# Patient Record
Sex: Male | Born: 1935 | Race: White | Hispanic: No | Marital: Single | Smoking: Never smoker
Health system: Southern US, Community
[De-identification: ages and names within clinical notes are randomized; demographics above are authoritative.]

## PROBLEM LIST (undated history)

## (undated) DIAGNOSIS — D126 Benign neoplasm of colon, unspecified: Secondary | ICD-10-CM

## (undated) DIAGNOSIS — K222 Esophageal obstruction: Secondary | ICD-10-CM

## (undated) DIAGNOSIS — M1990 Unspecified osteoarthritis, unspecified site: Secondary | ICD-10-CM

## (undated) DIAGNOSIS — I10 Essential (primary) hypertension: Secondary | ICD-10-CM

## (undated) DIAGNOSIS — H9190 Unspecified hearing loss, unspecified ear: Secondary | ICD-10-CM

## (undated) DIAGNOSIS — IMO0001 Reserved for inherently not codable concepts without codable children: Secondary | ICD-10-CM

## (undated) DIAGNOSIS — K224 Dyskinesia of esophagus: Secondary | ICD-10-CM

## (undated) DIAGNOSIS — K219 Gastro-esophageal reflux disease without esophagitis: Secondary | ICD-10-CM

## (undated) DIAGNOSIS — K449 Diaphragmatic hernia without obstruction or gangrene: Secondary | ICD-10-CM

## (undated) DIAGNOSIS — H547 Unspecified visual loss: Secondary | ICD-10-CM

## (undated) DIAGNOSIS — K2270 Barrett's esophagus without dysplasia: Secondary | ICD-10-CM

## (undated) HISTORY — PX: COLONOSCOPY: SHX174

## (undated) HISTORY — DX: Unspecified osteoarthritis, unspecified site: M19.90

## (undated) HISTORY — PX: TOTAL KNEE ARTHROPLASTY: SHX125

## (undated) HISTORY — DX: Diaphragmatic hernia without obstruction or gangrene: K44.9

## (undated) HISTORY — PX: TONSILLECTOMY AND ADENOIDECTOMY: SUR1326

## (undated) HISTORY — DX: Unspecified visual loss: H54.7

## (undated) HISTORY — DX: Benign neoplasm of colon, unspecified: D12.6

## (undated) HISTORY — DX: Essential (primary) hypertension: I10

## (undated) HISTORY — DX: Reserved for inherently not codable concepts without codable children: IMO0001

## (undated) HISTORY — PX: INNER EAR SURGERY: SHX679

## (undated) HISTORY — DX: Dyskinesia of esophagus: K22.4

## (undated) HISTORY — PX: SCROTAL SURGERY: SHX2387

## (undated) HISTORY — DX: Gastro-esophageal reflux disease without esophagitis: K21.9

## (undated) HISTORY — DX: Barrett's esophagus without dysplasia: K22.70

## (undated) HISTORY — DX: Esophageal obstruction: K22.2

## (undated) HISTORY — PX: UPPER GASTROINTESTINAL ENDOSCOPY: SHX188

## (undated) HISTORY — DX: Unspecified hearing loss, unspecified ear: H91.90

---

## 2004-06-15 DIAGNOSIS — K2270 Barrett's esophagus without dysplasia: Secondary | ICD-10-CM

## 2004-06-15 HISTORY — DX: Barrett's esophagus without dysplasia: K22.70

## 2005-03-04 ENCOUNTER — Ambulatory Visit: Payer: Self-pay | Admitting: Gastroenterology

## 2005-03-09 ENCOUNTER — Encounter (INDEPENDENT_AMBULATORY_CARE_PROVIDER_SITE_OTHER): Payer: Self-pay | Admitting: *Deleted

## 2005-03-09 ENCOUNTER — Ambulatory Visit (HOSPITAL_COMMUNITY): Admission: RE | Admit: 2005-03-09 | Discharge: 2005-03-09 | Payer: Self-pay | Admitting: Gastroenterology

## 2005-05-11 ENCOUNTER — Ambulatory Visit: Payer: Self-pay | Admitting: Gastroenterology

## 2005-05-19 ENCOUNTER — Ambulatory Visit: Payer: Self-pay | Admitting: Gastroenterology

## 2005-05-19 ENCOUNTER — Encounter (INDEPENDENT_AMBULATORY_CARE_PROVIDER_SITE_OTHER): Payer: Self-pay | Admitting: *Deleted

## 2005-05-19 ENCOUNTER — Encounter (INDEPENDENT_AMBULATORY_CARE_PROVIDER_SITE_OTHER): Payer: Self-pay | Admitting: Specialist

## 2006-03-21 ENCOUNTER — Emergency Department (HOSPITAL_COMMUNITY): Admission: EM | Admit: 2006-03-21 | Discharge: 2006-03-21 | Payer: Self-pay | Admitting: Emergency Medicine

## 2006-08-18 ENCOUNTER — Ambulatory Visit: Payer: Self-pay | Admitting: Gastroenterology

## 2008-06-22 ENCOUNTER — Inpatient Hospital Stay (HOSPITAL_COMMUNITY): Admission: RE | Admit: 2008-06-22 | Discharge: 2008-06-25 | Payer: Self-pay | Admitting: Orthopedic Surgery

## 2009-04-11 ENCOUNTER — Telehealth (INDEPENDENT_AMBULATORY_CARE_PROVIDER_SITE_OTHER): Payer: Self-pay

## 2009-04-15 ENCOUNTER — Encounter (INDEPENDENT_AMBULATORY_CARE_PROVIDER_SITE_OTHER): Payer: Self-pay

## 2009-05-20 ENCOUNTER — Ambulatory Visit: Payer: Self-pay | Admitting: Internal Medicine

## 2009-11-28 ENCOUNTER — Telehealth: Payer: Self-pay | Admitting: Internal Medicine

## 2010-07-15 NOTE — Progress Notes (Signed)
Summary: Schedule Office Visit.   Phone Note Outgoing Call Call back at [facility] Phone [phone]   Call placed by: Harlow Mares CMA Duncan Dull),  November 28, 2009 5:03 PM Call placed to: Patient Summary of Call: Left message on patients machine to call back. patient needs to schedule an office visit.  Initial call taken by: Harlow Mares CMA Duncan Dull),  November 28, 2009 5:04 PM  Follow-up for Phone Call        Left a message on the patient machine to call back and schedule a previsit and procedure with our office. A letter will be mailed to the patient.   Follow-up by: Harlow Mares CMA Duncan Dull),  December 19, 2009 2:49 PM

## 2010-09-29 LAB — CBC
HCT: 45.6 % (ref 39.0–52.0)
Hemoglobin: 15.5 g/dL (ref 13.0–17.0)
MCHC: 34.1 g/dL (ref 30.0–36.0)
MCV: 88.9 fL (ref 78.0–100.0)
Platelets: 217 K/uL (ref 150–400)
RBC: 5.12 MIL/uL (ref 4.22–5.81)
RDW: 13.2 % (ref 11.5–15.5)
WBC: 6.5 K/uL (ref 4.0–10.5)

## 2010-09-29 LAB — PROTIME-INR
INR: 1 (ref 0.00–1.49)
INR: 1.2 (ref 0.00–1.49)
INR: 2.3 — ABNORMAL HIGH (ref 0.00–1.49)
Prothrombin Time: 12.8 s (ref 11.6–15.2)
Prothrombin Time: 15.4 seconds — ABNORMAL HIGH (ref 11.6–15.2)
Prothrombin Time: 27.2 seconds — ABNORMAL HIGH (ref 11.6–15.2)

## 2010-09-29 LAB — COMPREHENSIVE METABOLIC PANEL WITH GFR
ALT: 23 U/L (ref 0–53)
AST: 28 U/L (ref 0–37)
Albumin: 4.1 g/dL (ref 3.5–5.2)
Alkaline Phosphatase: 34 U/L — ABNORMAL LOW (ref 39–117)
BUN: 23 mg/dL (ref 6–23)
CO2: 21 meq/L (ref 19–32)
Calcium: 9.1 mg/dL (ref 8.4–10.5)
Chloride: 101 meq/L (ref 96–112)
Creatinine, Ser: 1.01 mg/dL (ref 0.4–1.5)
GFR calc non Af Amer: >60 mL/min
Glucose, Bld: 90 mg/dL (ref 70–99)
Potassium: 3.5 meq/L (ref 3.5–5.1)
Sodium: 132 meq/L — ABNORMAL LOW (ref 135–145)
Total Bilirubin: 0.8 mg/dL (ref 0.3–1.2)
Total Protein: 6.9 g/dL (ref 6.0–8.3)

## 2010-09-29 LAB — DIFFERENTIAL
Basophils Absolute: 0 K/uL (ref 0.0–0.1)
Basophils Relative: 0 % (ref 0–1)
Eosinophils Absolute: 0.4 K/uL (ref 0.0–0.7)
Eosinophils Relative: 6 % — ABNORMAL HIGH (ref 0–5)
Lymphocytes Relative: 21 % (ref 12–46)
Lymphs Abs: 1.4 K/uL (ref 0.7–4.0)
Monocytes Absolute: 0.6 K/uL (ref 0.1–1.0)
Monocytes Relative: 9 % (ref 3–12)
Neutro Abs: 4.1 K/uL (ref 1.7–7.7)
Neutrophils Relative %: 63 % (ref 43–77)

## 2010-09-29 LAB — URINALYSIS, ROUTINE W REFLEX MICROSCOPIC
Bilirubin Urine: NEGATIVE
Nitrite: NEGATIVE
Specific Gravity, Urine: 1.018 (ref 1.005–1.030)
pH: 5.5 (ref 5.0–8.0)

## 2010-09-29 LAB — TYPE AND SCREEN: Antibody Screen: NEGATIVE

## 2010-09-29 LAB — HEMOGLOBIN AND HEMATOCRIT, BLOOD
HCT: 35.4 % — ABNORMAL LOW (ref 39.0–52.0)
HCT: 37.8 % — ABNORMAL LOW (ref 39.0–52.0)
Hemoglobin: 12 g/dL — ABNORMAL LOW (ref 13.0–17.0)
Hemoglobin: 12.8 g/dL — ABNORMAL LOW (ref 13.0–17.0)

## 2010-09-29 LAB — APTT: aPTT: 30 s (ref 24–37)

## 2010-10-28 NOTE — H&P (Signed)
NAMEKILLIAN, SCHWER              ACCOUNT NO.:  [id number]   MEDICAL RECORD NO.:  [id number]          PATIENT TYPE:  INP   LOCATION:  NA                           FACILITY:  [facility]   PHYSICIAN:  Georges Lynch. Gioffre, M.D.DATE OF BIRTH:  08-Sep-1934   DATE OF ADMISSION:  DATE OF DISCHARGE:                              HISTORY & PHYSICAL   CHIEF COMPLAINT:  Painful bilateral knees, right greater than left.   HISTORY OF PRESENT ILLNESS:  Mr. Angel Hatfield is a 76 year old gentleman with  painful range of motion in bilateral knees, been progressing over the  course of time.  Patient's evaluation shows that he has bone on bone  lateral compartment of his right knee, he has a significant valgus  deformity.  He has failed conservative treatment.  Patient has elected  to proceed with a total knee arthroplasty.   ALLERGIES:  PENICILLIN CAUSING SWELLING OF HIS TONGUE, TYLENOL CAUSED  DIFFICULTY URINATING.   CURRENT MEDICAL HISTORY:  1. Lisinopril/hydrochlorothiazide 20/12.5 mg.  2. Fish oil.  3. Vitamin E.  4. Vitamin C.   PRIMARY CARE PHYSICIAN:  Dr. Benedetto Goad.   CARDIOLOGIST:  Dr. Roger Shelter.   PAST MEDICAL HISTORY:  1. Hypertension.  2. Hearing impaired without hearing aids.  3. Vision impaired.  4. History of shingles.  5. History of hiatal hernia.  6. History of reflux.  7. History of esophageal strictures.   REVIEW OF SYSTEMS:  NEUROLOGICALLY:  He is grossly intact, he just  complains of some vision issues and hearing issues without any aids.  He  has had shingles in the past in his lower back.  PULMONARY:  He does  have asthma, as a child was hospitalized; nothing as an adult.  CARDIOVASCULAR:  No chest pains, last stress test probably 10 years  previous.  No recent chest pains, he is very active on the farm.  GI:  He has had some esophageal strictures stretched, he has reflux disease  that is well controlled with over the counter medicines.  GU:  He only  has problems when  he takes Tylenol with difficulty urinating.  ENDOCRINE:  Unremarkable.   PAST SURGICAL HISTORY:  1. Ear drum surgery.  2. Scrotal surgery.  3. Recent colonoscopy without any complications.   FAMILY MEDICAL HISTORY:  Father is deceased from emphysema at the age of  28, also congestive heart failure.  Mother is deceased at the age of 79  from a stroke.  One brother deceased at 28 from an accident from a  tractor.   SOCIAL HISTORY:  Patient is married, he works as a Visual merchandiser.  Never  smoked, has a couple beers a week.  Lives with his family.   PHYSICAL EXAMINATION:  VITALS:  Height is 5 foot 10, weight is 185,  blood pressure is 126/78, pulse is 70 and regular, respirations 12,  patient is afebrile.  GENERAL:  This is a healthy-appearing, well-developed gentleman,  conscious, alert and appropriate.  Appears to be a good historian,  appears to be in no extreme distress.  When he stands he does have a  slight valgus deformity  of his right lower extremity.  HEENT:  Head was normocephalic.  Pupils equal, round and reactive.  He  can hear fairly easily with a normal voice.  NECK:  Supple, no palpable lymphadenopathy, good range of motion.  CHEST:  Lung sounds were clear and equal bilaterally.  No wheezes, rales  or rhonchi.  HEART:  Regular rate and rhythm.  ABDOMEN:  Soft, nontender.  EXTREMITIES:  Upper extremities have good range of motion in shoulder,  elbows and wrists; motor strength is 5/5.  He does have a slight  deformity of the biceps on his left from a tear.  Lower extremities got  good range of motion of both hips without any discomfort, right knee is  able to fully extend and he can flex it back to 120 degrees.  He does  have an obvious valgus deformity when left knee is straight, he has good  full range of motion, calves are soft.  Good range of motion both  ankles.  PERIPHERAL VASCULAR:  Carotid pulses were 2+, no bruits.  Radial pulses  2+, dorsalis pedis pulses 1+.  No  edema or venous stasis changes in the  lower extremity.  NEURO:  Patient was conscious, alert and appropriate.  Appeared to be a  good historian.  Breast, rectal and GU exams were deferred at this time.   IMPRESSION:  1. 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  2. History of asthma as a child.  3. History of vision and hearing impaired.  4. History of esophageal strictures.  5. History of hiatal hernia and reflux disease.   PLAN:  Patient will undergo all labs and tests prior to having a right  total knee arthroplasty by Dr. Darrelyn Hillock on June 22, 2008.      Jamelle Rushing, P.A.    ______________________________  Georges Lynch Darrelyn Hillock, M.D.    RWK/MEDQ  D:  06/14/2008  T:  06/14/2008  Job:  [id number]

## 2010-10-28 NOTE — Op Note (Signed)
NAMEANTARIO, Angel Hatfield              ACCOUNT NO.:  [id number]   MEDICAL RECORD NO.:  [id number]          PATIENT TYPE:  INP   LOCATION:  1604                         FACILITY:  [facility]   PHYSICIAN:  Georges Lynch. Gioffre, M.D.DATE OF BIRTH:  Nov 01, 1934   DATE OF PROCEDURE:  06/22/2008  DATE OF DISCHARGE:                               OPERATIVE REPORT   SURGEON:  Georges Lynch. Darrelyn Hillock, M.D.   ASSISTANT:  Marlowe Kays, M.D.   PREOPERATIVE DIAGNOSIS:  Severe degenerative arthritis of the left knee  with a valgus deformity.   POSTOPERATIVE DIAGNOSIS:  Severe degenerative arthritis of the left knee  with a valgus deformity.   OPERATION:  Left total knee arthroplasty utilizing the DePuy system.  All three components were cemented.  I used vancomycin in the cement.  The patella was a size 41 mm with 3 pegs.  The femur was a size 4, right  posterior stabilized type, the tibial tray was a size 4, the insert was  a size 4, 12.5 mm thickness.   PROCEDURE:  Under general anesthesia, routine orthopedic prep and  draping of the left lower extremity was carried out.  He had 1 gram of  IV vancomycin preop.  At this time, the leg was exsanguinated with an  Esmarch and tourniquet was elevated at 350 mmHg.  Following that, the  knee was flexed and an anterior incision was made in the usual fashion.  Two flaps were created.  I then extended the knee and did a median  parapatellar incision.  I reflected the patella laterally, flexed the  knee and did medial and lateral meniscectomies.  I then excised the  anterior posterior cruciate ligaments.  Following that, an initial drill  hole was made in the intercondylar notch of the femur, a #1 jig was  inserted.  I removed 11 mm of thickness off the distal femur.  At this  time, a #2 jig was utilized for measurement purposes.  We measured the  femur to be a size 4 right.  At this time, the next jig was inserted and  I did my anterior-posterior chamfering cuts in  the usual fashion.  Following that, the tibia was exposed and I removed 4 mm thickness off  of the medial aspect of the tibial plateau, using the medial aspects of  the tibial plateaus as my baseline.  Following that, we had  to go back  and remove another 2 mm.  After this was done, I then cut my keel cut  out of the proximal tibial metaphysis, but prior to that I did use my  spacer blocks to measure my tension in the collateral ligaments.  I then  cut my notch cut out of the distal femur.  After that, we then inserted  our trial components and felt that the 12.5 mm thickness insert was the  most stable.  I then did a resurfacing procedure on the patella for a  size 41 mm patella.  Three drill holes were made in the patella after  the completion of the resurfacing.  We then utilized a trial patella and  we had an excellent fit.  I removed all trial components, thoroughly  water picked out the knee, dried the knee out and cemented all 3  components in simultaneously.  Following that, I removed all loose  pieces of cement in the usual fashion.  I then utilized a water pick to  make sure that we pressurized all the small fragments out of the knee  joint.  I then inserted my size 4, 12.5 mm thickness rotating platform,  reduced the  knee and had good function in flexion/extension and mediolateral  stability.  I then inserted a Hemovac drain and closed the knee in  layers in the usual fashion.  Sterile Neosporin dressings were applied.  The patient left the operating room in satisfactory condition.           ______________________________  Georges Lynch Darrelyn Hillock, M.D.     RAG/MEDQ  D:  06/22/2008  T:  06/23/2008  Job:  [id number]   cc:   Gloriajean Dell. Andrey Campanile, M.D.  Fax: [phone]

## 2010-10-31 NOTE — Discharge Summary (Signed)
Angel Hatfield, Angel Hatfield              ACCOUNT NO.:  [id number]   MEDICAL RECORD NO.:  [id number]          PATIENT TYPE:  INP   LOCATION:  1604                         FACILITY:  [facility]   PHYSICIAN:  Georges Lynch. Gioffre, M.D.DATE OF BIRTH:  [birth year]   DATE OF ADMISSION:  06/22/2008  DATE OF DISCHARGE:  06/25/2008                               DISCHARGE SUMMARY   DISPOSITION:  To home.   ADMISSION DIAGNOSES:  1. End-stage osteoarthritis of the right knee with valgus deformity.  2. History of asthma as a child  3. History of visual and hearing impairment.  4. History of esophageal strictures.  5. History of hiatal hernia and reflux disease.   DISCHARGE DIAGNOSES:  1. Right total knee arthroplasty.  2. History of asthma.  3. History of vision and hearing impairment.  4. History of esophageal strictures.  5. History of hiatal hernia and reflux disease.   HISTORY OF PRESENT ILLNESS:  The patient is a 76 year old gentleman with  painful range of motion of bilateral knees that has been progressively  worsening over time.  The patient's evaluation shows he has bone-on-bone  lateral compartment of his right knee.  He has significant valgus  deformity.  He has failed conservative treatment.  The patient has  elected to proceed with a total knee arthroplasty.   ALLERGIES:  1. PENICILLIN causing swelling of the tongue.  2. TYLENOL causing difficulty urinating.   CURRENT MEDICATIONS:  1. Lisinopril/hydrochlorothiazide 20/12.5.  2. Fish oil.  3. Vitamin E  4. Vitamin C.   SURGICAL PROCEDURES:  On June 22, 2008, the patient was taken to the  OR by Dr. Worthy Rancher assisted by Dr. Simonne Come.  Under general  anesthesia, the patient underwent a right total knee arthroplasty.  There were no complications.  Estimated blood loss was minimal.  The  patient was transferred to the recovery room then to the orthopedic  floor in good condition.  The patient had the following components  implanted:   A size 4 right femoral component; size 4 keel tibial tray;  size 4/12.5 thickness polyethylene bearing; size  41/3-peg patella.  All components were implanted with polymethyl  methacrylate with vancomycin mixed in.   CONSULTS:  The following routine consults were requested:  Physical  therapy, case manager, pharmacy for Coumadin monitoring.   HOSPITAL COURSE:  On June 22, 2008, the patient was admitted to [facility] under the care of Dr. Worthy Rancher.  The patient was taken  to the OR by Dr. Darrelyn Hillock.  The patient had a right total knee  arthroplasty performed without any complications.  The patient was  transferred to the recovery room and to the orthopedic floor in good  condition on IV antibiotics, pain medicines and Coumadin for DVT  prophylaxis.  Patient followed the total knee protocol.  The patient's  wound remained benign for any signs of infection and the leg remained  neuromotor vascularly intact.  The patient's vital signs remained  stable.  He remained afebrile.  The patient worked well with physical  therapy on a daily basis.  The patient was able to  wean off his IV  medications to p.o. meds well.  It was felt that on postoperative day  number 3, he was medically and orthopedically stable and ready for home.  He was  discharged with outpatient home health physical therapy  arrangements and in good condition for followup.  After discharge the  following day, there was conflict between the patient's insurance and  outpatient home health physical therapy.  The patient's case manager got  involved.  She has also discussed issues with Dr. Jeannetta Ellis office staff  over a 24 hour period.  Arrangements were corrected and made for the  patient to get outpatient home health physical therapy and Coumadin  monitoring.   LABORATORY DATA:  CBC on admission found WBC 6.5, hemoglobin 15.5,  hematocrit 45.6, platelets 217.  On discharge, his H&H was 10.8 and  31.1, INR was 2.3.   Routine chemistries on admission found sodium of  132, all the values within normal limits.  Urinalysis within normal  limits.   DIAGNOSTICS:  1. EKG on admission found normal sinus rhythm at 67 beats per minute.  2. Chest x-ray on admission found no acute thoracic findings      identified.  3. Postoperative knee x-ray shows expected postoperative appearance of      a right total knee arthroplasty.   DISCHARGE INSTRUCTIONS:  1. Diet:  No restrictions.  2. Activities:  The patient is to increase his activity with the use      of a walker.  3. Wound care:  He is to change his dressing daily.  4. Followup:  He needs a 2 week follow up evaluation with Dr. Darrelyn Hillock      in the office, [phone].   MEDICATIONS:  1. Dilaudid 2 mg 1-2 tablets every 4-6 hours for pain if needed.  2. Coumadin 5 mg once a day unless changed by home health physical      therapy  pharmacist.  3. Robaxin 500 mg once every 6 hours for muscle spasms if needed.  4. Lisinopril/hydrochlorothiazide 20/12.5 mg once a day.  5. Vitamin E 400 international units once a day.  6. Vitamin C 500 mg once a day.  7. Centrum Silver once a day.  8. Fish oil 1000 mg once a day.  9. Advil on hold until done with Coumadin.   CONDITION ON DISCHARGE:  Improved and good.      Jamelle Rushing, P.A.    ______________________________  Georges Lynch Darrelyn Hillock, M.D.    RWK/MEDQ  D:  07/11/2008  T:  07/11/2008  Job:  [id number]   cc:   Windy Fast A. Darrelyn Hillock, M.D.  Fax: [phone]

## 2010-10-31 NOTE — Assessment & Plan Note (Signed)
[facility]                         GASTROENTEROLOGY OFFICE NOTE   [name], [name]                     MRN:          [id number]  DATE:08/18/2006                            DOB:          02-Aug-1934    Angel Hatfield says he has been doing pretty well as long as he takes his pill.  He has a significant hiatal hernia with stricture.  He says he is  asymptomatic.  Swallows fine as long as he keeps taking his Prevacid  SoluTab.  Otherwise, he is feeling good.  He is still working a little  bit, kind of semi-retired.  He is now 76 years of age.  He weighs 194.  Blood pressure 142/82, pulse 88 and regular.  Cardiac symptoms are all  unremarkable.  Basically healthy-looking 76 year old.   IMPRESSION:  1. Gastroesophageal reflux disease with stricture, doing well as long      as he takes Prevacid SoluTabs.  2. History of allergies.  3. History of asthma.  4. History of mild arthritis.   RECOMMENDATIONS:  To continue on his Prevacid SoluTabs, and if he has  any further difficulty, will be more than happy to see him sometime in  the future.  I did suggest to him that he could be referred to Dr.  Leone Payor if he needs to in the future.     Angel Mort, MD  Electronically Signed    SML/MedQ  DD: 08/18/2006  DT: 08/19/2006  Job #: [id number]

## 2011-08-21 ENCOUNTER — Encounter: Payer: Self-pay | Admitting: Internal Medicine

## 2011-09-08 ENCOUNTER — Encounter: Payer: Self-pay | Admitting: Internal Medicine

## 2011-09-08 ENCOUNTER — Ambulatory Visit (INDEPENDENT_AMBULATORY_CARE_PROVIDER_SITE_OTHER): Payer: Medicare Other | Admitting: Internal Medicine

## 2011-09-08 ENCOUNTER — Encounter: Payer: Self-pay | Admitting: Gastroenterology

## 2011-09-08 VITALS — BP 126/82 | HR 72 | Ht 68.0 in | Wt 184.4 lb

## 2011-09-08 DIAGNOSIS — Z86010 Personal history of colonic polyps: Secondary | ICD-10-CM

## 2011-09-08 DIAGNOSIS — K222 Esophageal obstruction: Secondary | ICD-10-CM

## 2011-09-08 DIAGNOSIS — K2270 Barrett's esophagus without dysplasia: Secondary | ICD-10-CM

## 2011-09-08 DIAGNOSIS — R1314 Dysphagia, pharyngoesophageal phase: Secondary | ICD-10-CM

## 2011-09-08 DIAGNOSIS — R1310 Dysphagia, unspecified: Secondary | ICD-10-CM

## 2011-09-08 DIAGNOSIS — R1319 Other dysphagia: Secondary | ICD-10-CM

## 2011-09-08 MED ORDER — LANSOPRAZOLE 30 MG PO CPDR: 30.0000 mg | DELAYED_RELEASE_CAPSULE | Freq: Every day | ORAL | Status: AC

## 2011-09-08 MED ORDER — MOVIPREP 100 G PO SOLR: 1.0000 | Freq: Once | ORAL | Status: DC

## 2011-09-08 NOTE — Progress Notes (Signed)
Subjective:    Patient ID: Angel Hatfield, male    DOB: [birth year], 77 y.o.   MRN: [id number]  HPI This is a very pleasant area elderly white man, who presents with his daughter, problem swallowing. He reports intermittent solid food dysphagia. It is not associated bleeding or weight loss. It's a chronic recurrent problem. He last underwent upper endoscopy with dilation in 2006. He had been taking a PPI, using Prevacid solute tabs, last seen in 2008 by my retired partner. At that time he was doing well on that medication. There is a history of  Barrett's esophagus in the past with low-grade dysplasia in 2006 but he has not presented for follow-up endoscopy as recommended by letter in the past. Sometime in the past year or so he develop increasing frequency of intermittent solid food dysphagia occasionally with regurgitation, when it impacted in the esophagus he feels a sensation in his suprasternal notch.  He also has a history of an adenomatous colon polyp in 2006 has not had a followup colonoscopy. He is not having any significant colon symptoms. There is no bleeding or bowel habit change.  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  Allergies  Allergen Reactions  . Penicillins    Medications are lisinopril HCTZ    Past Medical History  Diagnosis Date  . Stricture of esophagus   . Esophageal dysmotility   . Barrett's esophagus 2006  . Adenomatous colon polyp   . GERD (gastroesophageal reflux disease)   . Asthma   . Hiatal hernia   . Arthritis   . Visual impairment   . Hearing impairment   . HTN (hypertension)    Past Surgical History  Procedure Date  . Total knee arthroplasty     right  . Inner ear surgery   . Scrotal surgery   . Tonsillectomy and adenoidectomy   . Colonoscopy 05-19-2005  . Upper gastrointestinal endoscopy 05-19-2005    History   Social History  . Marital Status: married    Spouse Name: N/A    Number of Children: 3 daughters  .     Occupational History  . farmer    Social History Main Topics  . Smoking status: Never Smoker   . Smokeless tobacco: Never Used  . Alcohol Use: 3.5 oz/week    7 drink(s) per week  . Drug Use: No  .       Family History  Problem Relation Age of Onset  . Stroke Mother   . Heart failure Father   . Emphysema Father   . Alzheimer's disease Mother         Review of Systems Positive for arthritis and back pain and decreased hearing. All other review of systems negative or as mentioned in the history of present illness    Objective:   Physical Exam General:  Well-developed, well-nourished and in no acute distress Eyes:  anicteric. ENT:   Mouth and posterior pharynx free of lesions.  Neck:   supple w/o thyromegaly or mass.  Lungs: Clear to auscultation bilaterally. Heart:  S1S2, no rubs, murmurs, gallops. Abdomen:  soft, non-tender, no hepatosplenomegaly, hernia, or mass and BS+.  Lymph:  no cervical or supraclavicular adenopathy. Extremities:   no edema Neuro:  A&O x 3.  Psych:  appropriate mood and  Affect.   Data Reviewed: Prior colonoscopy, upper endoscopy reports as  well as pathology       Assessment & Plan:   1. Esophageal dysphagia   2. Esophageal stricture   3. Barrett's esophagus with history of low-grade dysplasia 2006  He needs an upper GI endoscopy with esophageal dilation and biopsy to evaluate and treat these problems above. Risks benefits and indications were explained and he and his daughter understand and agree to proceed. I have advised him to cut his foods smaller and chew more carefully in the interim. Will restart PPI with lansoprazole 30 mg daily   4. Personal history of colonic polyps    It is appropriate to schedule a screening and surveillance colonoscopy given his history of an adenomatous colon polyp in 2006. Risks  benefits and indications are explained and he understands and agrees to proceed.    Cc: Benedetto Goad, MD

## 2011-09-08 NOTE — Patient Instructions (Signed)
Your procedure has been scheduled for 10/07/2011, please follow the seperate instructions.  Your prescription(s) have been sent to you pharmacy.  Dr Leone Payor has sent Prevacid to your pharmacy please take one capsule 30 minutes before breakfast everyday.

## 2011-10-07 ENCOUNTER — Ambulatory Visit (AMBULATORY_SURGERY_CENTER): Payer: Medicare Other | Admitting: Internal Medicine

## 2011-10-07 ENCOUNTER — Encounter: Payer: Self-pay | Admitting: Internal Medicine

## 2011-10-07 VITALS — BP 129/79 | HR 66 | Temp 95.9°F | Resp 14 | Ht 68.0 in | Wt 184.0 lb

## 2011-10-07 DIAGNOSIS — K2270 Barrett's esophagus without dysplasia: Secondary | ICD-10-CM

## 2011-10-07 DIAGNOSIS — K222 Esophageal obstruction: Secondary | ICD-10-CM

## 2011-10-07 DIAGNOSIS — Z86010 Personal history of colonic polyps: Secondary | ICD-10-CM

## 2011-10-07 DIAGNOSIS — R1314 Dysphagia, pharyngoesophageal phase: Secondary | ICD-10-CM

## 2011-10-07 DIAGNOSIS — Z1211 Encounter for screening for malignant neoplasm of colon: Secondary | ICD-10-CM

## 2011-10-07 DIAGNOSIS — K449 Diaphragmatic hernia without obstruction or gangrene: Secondary | ICD-10-CM

## 2011-10-07 MED ORDER — SODIUM CHLORIDE 0.9 % IV SOLN: 500.0000 mL | INTRAVENOUS | Status: DC

## 2011-10-07 NOTE — Op Note (Signed)
[facility] [address] [place], [place]  [address]  COLONOSCOPY PROCEDURE REPORT  PATIENT:  Hatfield, Angel  MR#:  [id number] BIRTHDATE:  1935/03/07, 77 yrs. old  GENDER:  male ENDOSCOPIST:  Iva Boop, MD, [facility]  PROCEDURE DATE:  10/07/2011 PROCEDURE:  Colonoscopy 62[address] ASA CLASS:  Class II INDICATIONS:  surveillance and high-risk screening, history of pre-cancerous (adenomatous) colon polyps diminutive adenoma removed 2006 MEDICATIONS:   There was residual sedation effect present from prior procedure., These medications were titrated to patient response per physician's verbal order, Versed 2 mg IV, Fentanyl 25 mcg IV  DESCRIPTION OF PROCEDURE:   After the risks benefits and alternatives of the procedure were thoroughly explained, informed consent was obtained.  Digital rectal exam was performed and revealed no rectal masses and an enlarged prostate.  Mildly enlarged without nodules. The LB CF-[id number] endoscope was introduced through the anus and advanced to the cecum, which was identified by both the appendix and ileocecal valve, without limitations.  The quality of the prep was excellent, using MoviPrep.  The instrument was then slowly withdrawn as the colon was fully examined. <<PROCEDUREIMAGES>>  FINDINGS:  Severe diverticulosis was found in the sigmoid colon. This was otherwise a normal examination of the colon.   Retroflexed views in the rectum revealed no abnormalities.    The time to cecum = 8:59 minutes. The scope was then withdrawn in 11:12 minutes from the cecum and the procedure completed. COMPLICATIONS:  None ENDOSCOPIC IMPRESSION: 1) Severe diverticulosis in the sigmoid colon 2) Otherwise normal examination, excellent prep  REPEAT EXAM:  In for as needed.  Iva Boop, MD, Angel Hatfield  CC:  Benedetto Goad, MD and The Patient  n. eSIGNED:   Iva Boop at 10/07/2011 04:08 PM  Robby Sermon, [phone]

## 2011-10-07 NOTE — Patient Instructions (Signed)
YOU HAD AN ENDOSCOPIC PROCEDURE TODAY AT THE [facility]: Refer to the procedure report that was given to you for any specific questions about what was found during the examination.  If the procedure report does not answer your questions, please call your gastroenterologist to clarify.  If you requested that your care partner not be given the details of your procedure findings, then the procedure report has been included in a sealed envelope for you to review at your convenience later.  YOU SHOULD EXPECT: Some feelings of bloating in the abdomen. Passage of more gas than usual.  Walking can help get rid of the air that was put into your GI tract during the procedure and reduce the bloating. If you had a lower endoscopy (such as a colonoscopy or flexible sigmoidoscopy) you may notice spotting of blood in your stool or on the toilet paper. If you underwent a bowel prep for your procedure, then you may not have a normal bowel movement for a few days.  DIET:       SEE DILATION DIET            Your first meal following the procedure should be a light meal and then it is ok to progress to your normal diet.  A half-sandwich or bowl of soup is an example of a good first meal.  Heavy or fried foods are harder to digest and may make you feel nauseous or bloated.  Likewise meals heavy in dairy and vegetables can cause extra gas to form and this can also increase the bloating.  Drink plenty of fluids but you should avoid alcoholic beverages for 24 hours.  ACTIVITY: Your care partner should take you home directly after the procedure.  You should plan to take it easy, moving slowly for the rest of the day.  You can resume normal activity the day after the procedure however you should NOT DRIVE or use heavy machinery for 24 hours (because of the sedation medicines used during the test).    SYMPTOMS TO REPORT IMMEDIATELY: A gastroenterologist can be reached at any hour.  During normal business hours, 8:30 AM  to 5:00 PM Monday through Friday, call [phone].  After hours and on weekends, please call the GI answering service at [phone] who will take a message and have the physician on call contact you.   Following lower endoscopy (colonoscopy or flexible sigmoidoscopy):  Excessive amounts of blood in the stool  Significant tenderness or worsening of abdominal pains  Swelling of the abdomen that is new, acute  Fever of 100F or higher  Following upper endoscopy (EGD)  Vomiting of blood or coffee ground material  New chest pain or pain under the shoulder blades  Painful or persistently difficult swallowing  New shortness of breath  Fever of 100F or higher  Black, tarry-looking stools  FOLLOW UP: If any biopsies were taken you will be contacted by phone or by letter within the next 1-3 weeks.  Call your gastroenterologist if you have not heard about the biopsies in 3 weeks.  Our staff will call the home number listed on your records the next business day following your procedure to check on you and address any questions or concerns that you may have at that time regarding the information given to you following your procedure. This is a courtesy call and so if there is no answer at the home number and we have not heard from you through the emergency physician on call,  we will assume that you have returned to your regular daily activities without incident.  SIGNATURES/CONFIDENTIALITY: You and/or your care partner have signed paperwork which will be entered into your electronic medical record.  These signatures attest to the fact that that the information above on your After Visit Summary has been reviewed and is understood.  Full responsibility of the confidentiality of this discharge information lies with you and/or your care-partner.

## 2011-10-07 NOTE — Progress Notes (Signed)
Patient did not experience any of the following events: a burn prior to discharge; a fall within the facility; wrong site/side/patient/procedure/implant event; or a hospital transfer or hospital admission upon discharge from the facility. (G8907) Patient did not have preoperative order for IV antibiotic SSI prophylaxis. (G8918)  

## 2011-10-07 NOTE — Op Note (Addendum)
[facility] [address] [place], [place]  [address]  ENDOSCOPY PROCEDURE REPORT  PATIENT:  Angel, Hatfield  MR#:  [id number] BIRTHDATE:  05-27-1935, 77 yrs. old  GENDER:  male  ENDOSCOPIST:  Iva Boop, MD, [facility]  PROCEDURE DATE:  10/07/2011 PROCEDURE:  EGD with biopsy, [address], Elease Hashimoto Dilation of Esophagus ASA CLASS:  Class II INDICATIONS:  dysphagia, h/o Barrett's Esophagus and low-grade dysplasia  MEDICATIONS:   These medications were titrated to patient response per physician's verbal order, Fentanyl 75 mcg IV, Versed 8 mg IV TOPICAL ANESTHETIC:  Cetacaine Spray  DESCRIPTION OF PROCEDURE:   After the risks benefits and alternatives of the procedure were thoroughly explained, informed consent was obtained.  The LB Trial GIF-190 endoscope was introduced through the mouth and advanced to the second portion of the duodenum, without limitations.  The instrument was slowly withdrawn as the mucosa was fully examined. <<PROCEDUREIMAGES>>  Barrett's esophagus was found in the distal esophagus. Three areas < 1 cm, raised, prominent mucosa. No ulcers. Z-line at 33 cm and the Barrett's is just proximal. Multiple biopsies were obtained and sent to pathology.  A hiatal hernia was found. It was 2 cm in size. 33-35 cm  Otherwise the examination was normal. Retroflexed views revealed no abnormalities.    The scope was then withdrawn from the patient, a 63 [place] Maloney dilator passed without difficulty (slight heme post biopsies) and the procedure completed.  COMPLICATIONS:  None  ENDOSCOPIC IMPRESSION: 1) Barrett's esophagus in the distal esophagus 2) 2 cm hiatal hernia 3) Otherwise normal examination - 54 French Maloney dilation performed 4) addendum: mild patchy antral gastritis also - red mucosa RECOMMENDATIONS: 1) Await biopsy results 2) Post-dilation diet 3) Colonoscopy next  REPEAT EXAM:  In for EGD, pending biopsy results.  Iva Boop, MD,  Clementeen Graham  CC:  Benedetto Goad, MD and The Patient  n. REVISED:  10/07/2011 04:15 PM eSIGNED:   Iva Boop at 10/07/2011 04:15 PM  Robby Sermon, [phone]

## 2011-10-08 ENCOUNTER — Telehealth: Payer: Self-pay | Admitting: *Deleted

## 2011-10-08 NOTE — Telephone Encounter (Signed)
  Follow up Call-  Call back number 10/07/2011  Post procedure Call Back phone  # [phone]  Permission to leave phone message Yes     Patient questions:  Do you have a fever, pain , or abdominal swelling? no Pain Score  0 *  Have you tolerated food without any problems? yes  Have you been able to return to your normal activities? yes  Do you have any questions about your discharge instructions: Diet   no Medications  no Follow up visit  no  Do you have questions or concerns about your Care? no  Actions: * If pain score is 4 or above: No action needed, pain <4.

## 2011-10-13 ENCOUNTER — Encounter: Payer: Self-pay | Admitting: Internal Medicine

## 2011-10-13 NOTE — Progress Notes (Signed)
Quick Note:  Barrett's, no dysplasia Repeat endoscopy about 10/2014 ______

## 2012-01-03 ENCOUNTER — Encounter: Payer: Self-pay | Admitting: Emergency Medicine

## 2012-01-03 ENCOUNTER — Emergency Department (INDEPENDENT_AMBULATORY_CARE_PROVIDER_SITE_OTHER): Payer: Medicare Other

## 2012-01-03 ENCOUNTER — Emergency Department
Admission: EM | Admit: 2012-01-03 | Discharge: 2012-01-03 | Disposition: A | Discharge status: Home / Self Care | Payer: Medicare Other | Source: Home / Self Care | Attending: Family Medicine | Admitting: Family Medicine

## 2012-01-03 DIAGNOSIS — M25529 Pain in unspecified elbow: Secondary | ICD-10-CM

## 2012-01-03 DIAGNOSIS — L03119 Cellulitis of unspecified part of limb: Secondary | ICD-10-CM

## 2012-01-03 DIAGNOSIS — IMO0002 Reserved for concepts with insufficient information to code with codable children: Secondary | ICD-10-CM

## 2012-01-03 DIAGNOSIS — Z23 Encounter for immunization: Secondary | ICD-10-CM

## 2012-01-03 DIAGNOSIS — W19XXXA Unspecified fall, initial encounter: Secondary | ICD-10-CM

## 2012-01-03 MED ORDER — SULFAMETHOXAZOLE-TRIMETHOPRIM 800-160 MG PO TABS: 1.0000 | ORAL_TABLET | Freq: Two times a day (BID) | ORAL | Status: AC

## 2012-01-03 MED ORDER — TETANUS-DIPHTH-ACELL PERTUSSIS 5-2.5-18.5 LF-MCG/0.5 IM SUSP
0.5000 mL | Freq: Once | INTRAMUSCULAR | Status: AC
  Administered 2012-01-03: 0.5 mL via INTRAMUSCULAR

## 2012-01-03 MED ORDER — MUPIROCIN CALCIUM 2 % EX CREA: TOPICAL_CREAM | Freq: Three times a day (TID) | CUTANEOUS | Status: AC

## 2012-01-03 NOTE — ED Provider Notes (Signed)
History     CSN: [id number]  Arrival date & time 01/03/12  1509   First MD Initiated Contact with Patient 01/03/12 1523      Chief Complaint  Patient presents with  . Abrasion      HPI Comments: Patient fell 4 days ago, abrading his left elbow.  Over the past two days he has had increased drainage from the injured area, and mild swelling around elbow without pain.  Today he noticed a red rash above and below the elbow.  No fevers, chills, and sweats.  He is not sure when his last tetanus immunization was.  Patient is a 77 y.o. male presenting with arm injury. The history is provided by the patient.  Arm Injury  Episode onset: 4 days ago. The incident occurred at home. The injury mechanism was a fall. Context: walking on concrete. Torso Injury Location:   There is an injury to the left elbow. The patient is experiencing no pain. It is unknown if a foreign body is present. His tetanus status is unknown.    Past Medical History  Diagnosis Date  . Stricture of esophagus   . Esophageal dysmotility   . Barrett's esophagus 2006  . Adenomatous colon polyp   . GERD (gastroesophageal reflux disease)   . Asthma   . Hiatal hernia   . Arthritis   . Visual impairment   . Hearing impairment   . HTN (hypertension)     Past Surgical History  Procedure Date  . Total knee arthroplasty     right  . Inner ear surgery   . Scrotal surgery   . Tonsillectomy and adenoidectomy   . Colonoscopy multiple  . Upper gastrointestinal endoscopy multiple    Family History  Problem Relation Age of Onset  . Stroke Mother   . Heart failure Father   . Emphysema Father   . Alzheimer's disease Mother     History  Substance Use Topics  . Smoking status: Never Smoker   . Smokeless tobacco: Never Used  . Alcohol Use: 3.5 oz/week    7 drink(s) per week      Review of Systems  All other systems reviewed and are negative.    Allergies  Penicillins  Home Medications   Current Outpatient Rx   Name Route Sig Dispense Refill  . LANSOPRAZOLE 30 MG PO CPDR Oral Take 1 capsule (30 mg total) by mouth daily. Take 30 minutes before breakfast 30 capsule 11  . LISINOPRIL-HYDROCHLOROTHIAZIDE 20-12.5 MG PO TABS Oral Take 0.5 tablets by mouth daily.    Marland Kitchen MUPIROCIN CALCIUM 2 % EX CREA Topical Apply topically 3 (three) times daily. 15 g 0  . SULFAMETHOXAZOLE-TRIMETHOPRIM 800-160 MG PO TABS Oral Take 1 tablet by mouth 2 (two) times daily. 14 tablet 0  . TRAZODONE HCL 100 MG PO TABS        BP 121/76  Pulse 65  Temp 98 F (36.7 C) (Oral)  Resp 18  Ht 5\' 7"  (1.702 m)  Wt 179 lb (81.194 kg)  BMI 28.04 kg/m2  SpO2 96%  Physical Exam  Nursing note and vitals reviewed. Constitutional: He is oriented to person, place, and time. He appears well-developed and well-nourished. No distress.  HENT:  Head: Normocephalic and atraumatic.  Eyes: Conjunctivae are normal. Pupils are equal, round, and reactive to light.  Musculoskeletal: Normal range of motion. He exhibits tenderness.       Left elbow: He exhibits swelling. He exhibits normal range of motion, no effusion, no deformity  and no laceration. no tenderness found.       Arms:      Left elbow reveals 1cm abrasion just distal to the olecranon process as noted on diagram.  There is a small amount of drainage from the lesion.  Surrounding area has mild swelling and warmth, but no tenderness.  There is mottled macular erythema above and below the elbow.  Distal Neurovascular function is intact.  Elbow has full range of motion   Neurological: He is alert and oriented to person, place, and time.  Skin: Skin is warm and dry. Rash noted.    ED Course  Procedures none   Labs Reviewed  WOUND CULTURE pending      1. Cellulitis of elbow       MDM  Wound culture pending.  Tdap administered. Begin Septra DS.   Apply Bactroban and bandage daily.  Keep clean and dry.  Apply heat pad two or three times daily. Followup with Family Doctor if not  improved in about 3 days.         Lattie Haw, MD 01/05/12 [phone]

## 2012-01-03 NOTE — ED Notes (Signed)
Patient reports falling on concrete and scratching left elbow/lower arm; good ROM; now has discolored area proximal to abrasions and some draining from abrasions.

## 2012-01-03 NOTE — ED Notes (Signed)
Took Aleve at 0800 today.

## 2012-01-06 ENCOUNTER — Telehealth: Payer: Self-pay | Admitting: Family Medicine

## 2012-01-07 ENCOUNTER — Telehealth: Payer: Self-pay | Admitting: *Deleted

## 2012-01-07 LAB — WOUND CULTURE
Gram Stain: NONE SEEN
Gram Stain: NONE SEEN

## 2013-01-11 ENCOUNTER — Other Ambulatory Visit: Payer: Self-pay | Admitting: Family Medicine

## 2013-01-11 DIAGNOSIS — R519 Headache, unspecified: Secondary | ICD-10-CM

## 2013-01-11 DIAGNOSIS — F688 Other specified disorders of adult personality and behavior: Secondary | ICD-10-CM

## 2013-01-15 ENCOUNTER — Ambulatory Visit
Admission: RE | Admit: 2013-01-15 | Discharge: 2013-01-15 | Disposition: A | Discharge status: Home / Self Care | Payer: Medicare Other | Source: Ambulatory Visit | Attending: Family Medicine | Admitting: Family Medicine

## 2013-01-15 ENCOUNTER — Other Ambulatory Visit: Payer: Medicare Other

## 2013-01-15 DIAGNOSIS — R519 Headache, unspecified: Secondary | ICD-10-CM

## 2013-01-15 DIAGNOSIS — F688 Other specified disorders of adult personality and behavior: Secondary | ICD-10-CM

## 2014-10-17 ENCOUNTER — Emergency Department (HOSPITAL_COMMUNITY)
Admission: EM | Admit: 2014-10-17 | Discharge: 2014-10-17 | Disposition: A | Discharge status: Home / Self Care | Payer: Medicare Other | Attending: Emergency Medicine | Admitting: Emergency Medicine

## 2014-10-17 ENCOUNTER — Encounter (HOSPITAL_COMMUNITY): Payer: Self-pay

## 2014-10-17 ENCOUNTER — Emergency Department (HOSPITAL_COMMUNITY): Payer: Medicare Other

## 2014-10-17 DIAGNOSIS — J45909 Unspecified asthma, uncomplicated: Secondary | ICD-10-CM | POA: Insufficient documentation

## 2014-10-17 DIAGNOSIS — Z23 Encounter for immunization: Secondary | ICD-10-CM | POA: Diagnosis not present

## 2014-10-17 DIAGNOSIS — H9190 Unspecified hearing loss, unspecified ear: Secondary | ICD-10-CM | POA: Diagnosis not present

## 2014-10-17 DIAGNOSIS — Y9289 Other specified places as the place of occurrence of the external cause: Secondary | ICD-10-CM | POA: Insufficient documentation

## 2014-10-17 DIAGNOSIS — Y990 Civilian activity done for income or pay: Secondary | ICD-10-CM | POA: Diagnosis not present

## 2014-10-17 DIAGNOSIS — Y9389 Activity, other specified: Secondary | ICD-10-CM | POA: Diagnosis not present

## 2014-10-17 DIAGNOSIS — Z880 Allergy status to penicillin: Secondary | ICD-10-CM | POA: Insufficient documentation

## 2014-10-17 DIAGNOSIS — S68125A Partial traumatic metacarpophalangeal amputation of left ring finger, initial encounter: Secondary | ICD-10-CM | POA: Diagnosis present

## 2014-10-17 DIAGNOSIS — Z8719 Personal history of other diseases of the digestive system: Secondary | ICD-10-CM | POA: Diagnosis not present

## 2014-10-17 DIAGNOSIS — S62635B Displaced fracture of distal phalanx of left ring finger, initial encounter for open fracture: Secondary | ICD-10-CM | POA: Insufficient documentation

## 2014-10-17 DIAGNOSIS — Z86018 Personal history of other benign neoplasm: Secondary | ICD-10-CM | POA: Insufficient documentation

## 2014-10-17 DIAGNOSIS — Z8739 Personal history of other diseases of the musculoskeletal system and connective tissue: Secondary | ICD-10-CM | POA: Insufficient documentation

## 2014-10-17 DIAGNOSIS — I10 Essential (primary) hypertension: Secondary | ICD-10-CM | POA: Insufficient documentation

## 2014-10-17 DIAGNOSIS — Z79899 Other long term (current) drug therapy: Secondary | ICD-10-CM | POA: Diagnosis not present

## 2014-10-17 DIAGNOSIS — S68119A Complete traumatic metacarpophalangeal amputation of unspecified finger, initial encounter: Secondary | ICD-10-CM

## 2014-10-17 DIAGNOSIS — W240XXA Contact with lifting devices, not elsewhere classified, initial encounter: Secondary | ICD-10-CM | POA: Diagnosis not present

## 2014-10-17 DIAGNOSIS — T148XXA Other injury of unspecified body region, initial encounter: Secondary | ICD-10-CM

## 2014-10-17 MED ORDER — CEPHALEXIN 500 MG PO CAPS: 500.0000 mg | ORAL_CAPSULE | Freq: Four times a day (QID) | ORAL | Status: DC

## 2014-10-17 MED ORDER — BUPIVACAINE HCL (PF) 0.5 % IJ SOLN
10.0000 mL | Freq: Once | INTRAMUSCULAR | Status: AC
  Administered 2014-10-17: 10 mL
  Filled 2014-10-17: qty 30

## 2014-10-17 MED ORDER — CEPHALEXIN 500 MG PO CAPS
1000.0000 mg | ORAL_CAPSULE | Freq: Once | ORAL | Status: AC
  Administered 2014-10-17: 1000 mg via ORAL
  Filled 2014-10-17: qty 2

## 2014-10-17 MED ORDER — TETANUS-DIPHTH-ACELL PERTUSSIS 5-2.5-18.5 LF-MCG/0.5 IM SUSP
0.5000 mL | Freq: Once | INTRAMUSCULAR | Status: AC
  Administered 2014-10-17: 0.5 mL via INTRAMUSCULAR
  Filled 2014-10-17: qty 0.5

## 2014-10-17 MED ORDER — TRAMADOL HCL 50 MG PO TABS: 50.0000 mg | ORAL_TABLET | Freq: Four times a day (QID) | ORAL | Status: DC | PRN

## 2014-10-17 NOTE — Discharge Instructions (Signed)
Please keep the dressing on for 24 hours or until you see the doctor tomorrow. Fingertip Injuries and Amputations Fingertip injuries are common and often get injured because they are last to escape when pulling your hand out of harm's way. You have amputated (cut off) part of your finger. How this turns out depends largely on how much was amputated. If just the tip is amputated, often the end of the finger will grow back and the finger may return to much the same as it was before the injury.  If more of the finger is missing, your caregiver has done the best with the tissue remaining to allow you to keep as much finger as is possible. Your caregiver after checking your injury has tried to leave you with a painless fingertip that has durable, feeling skin. If possible, your caregiver has tried to maintain the finger's length and appearance and preserve its fingernail.  Please read the instructions outlined below and refer to this sheet in the next few weeks. These instructions provide you with general information on caring for yourself. Your caregiver may also give you specific instructions. While your treatment has been done according to the most current medical practices available, unavoidable complications occasionally occur. If you have any problems or questions after discharge, please call your caregiver. HOME CARE INSTRUCTIONS   You may resume normal diet and activities as directed or allowed.  Keep your hand elevated above the level of your heart. This helps decrease pain and swelling.  Keep ice packs (or a bag of ice wrapped in a towel) on the injured area for 15-20 minutes, 03-04 times per day, for the first two days.  Change dressings if necessary or as directed.  Clean the wound daily or as directed.  Only take over-the-counter or prescription medicines for pain, discomfort, or fever as directed by your caregiver.  Keep appointments as directed. SEEK IMMEDIATE MEDICAL CARE IF:  You  develop redness, swelling, numbness or increasing pain in the wound.  There is pus coming from the wound.  You develop an unexplained oral temperature above 102 F (38.9 C) or as your caregiver suggests.  There is a foul (bad) smell coming from the wound or dressing.  There is a breaking open of the wound (edges not staying together) after sutures or staples have been removed. MAKE SURE YOU:   Understand these instructions.  Will watch your condition.  Will get help right away if you are not doing well or get worse. Document Released: 04/22/2005 Document Revised: 08/24/2011 Document Reviewed: 03/21/2008 [facility] Patient Information 2015 [place], [place]. This information is not intended to replace advice given to you by your health care provider. Make sure you discuss any questions you have with your health care provider.

## 2014-10-17 NOTE — ED Provider Notes (Signed)
CSN: [id number]     Arrival date & time 10/17/14  1226 History  This chart was scribed for Margarita Mail, PA-C working with [place], DO by Mercy Moore, ED Scribe. This patient was seen in room WTR7/WTR7 and the patient's care was started at 1:09 PM.   Chief Complaint  Patient presents with  . Finger Amputation    The history is provided by the patient. No language interpreter was used.   HPI Comments: Angel Hatfield is a 80 y.o. male who presents to the Emergency Department with partial amputation of distal fourth left finger incurred at work today approximately one hour ago. Patient reports trapping his finger in pulley system/belt severing his finger just distal to DIP joint. The detached finger has not been found despite searching. Patient with PMHx of Hypertension states that he is not on anticoagulants. Patient due for updated Tetanus. Patient is right hand dominant.   Past Medical History  Diagnosis Date  . Stricture of esophagus   . Esophageal dysmotility   . Barrett's esophagus 2006  . Adenomatous colon polyp   . GERD (gastroesophageal reflux disease)   . Asthma   . Hiatal hernia   . Arthritis   . Visual impairment   . Hearing impairment   . HTN (hypertension)    Past Surgical History  Procedure Laterality Date  . Total knee arthroplasty      right  . Inner ear surgery    . Scrotal surgery    . Tonsillectomy and adenoidectomy    . Colonoscopy  multiple  . Upper gastrointestinal endoscopy  multiple   Family History  Problem Relation Age of Onset  . Stroke Mother   . Heart failure Father   . Emphysema Father   . Alzheimer's disease Mother    History  Substance Use Topics  . Smoking status: Never Smoker   . Smokeless tobacco: Never Used  . Alcohol Use: 3.5 oz/week    7 Standard drinks or equivalent per week    Review of Systems  Constitutional: Negative for fever.  Skin: Positive for wound.      Allergies  Penicillins  Home Medications    Prior to Admission medications   Medication Sig Start Date End Date Taking? Authorizing Provider  lisinopril-hydrochlorothiazide (PRINZIDE,ZESTORETIC) 20-12.5 MG per tablet Take 0.5 tablets by mouth daily.    Historical Provider, MD  traZODone (DESYREL) 100 MG tablet  09/22/11   Historical Provider, MD   Triage Vitals: BP 145/79 mmHg  Pulse 75  Temp(Src) 97.9 F (36.6 C) (Oral)  Resp 16  SpO2 95% Physical Exam  Constitutional: He is oriented to person, place, and time. He appears well-developed and well-nourished. No distress.  HENT:  Head: Normocephalic and atraumatic.  Eyes: EOM are normal.  Neck: Neck supple. No tracheal deviation present.  Cardiovascular: Normal rate.   Pulmonary/Chest: Effort normal. No respiratory distress.  Musculoskeletal: Normal range of motion.  Left hand, fourth finger: amputation of distal phalanx. Able to flex and extend at PIP and DIP. Bleeding controlled.   Neurological: He is alert and oriented to person, place, and time.  Skin: Skin is warm and dry.  Psychiatric: He has a normal mood and affect. His behavior is normal.  Nursing note and vitals reviewed.       ED Course  NERVE BLOCK Date/Time: 10/18/2014 9:16 AM Performed by: Margarita Mail Authorized by: Margarita Mail Consent: Verbal consent obtained. Risks and benefits: risks, benefits and alternatives were discussed Consent given by: patient Patient  identity confirmed: provided demographic data Indications: pain relief and extensive wound Body area: upper extremity Nerve: digital Laterality: left Patient sedated: no Preparation: Patient was prepped and draped in the usual sterile fashion. Patient position: sitting Needle gauge: 25 G Location technique: anatomical landmarks Local anesthetic: bupivacaine 0.5% without epinephrine Anesthetic total: 8 ml Outcome: pain improved Patient tolerance: Patient tolerated the procedure well with no immediate complications   (including  critical care time)  COORDINATION OF CARE: 12:40 PM- Discussed treatment plan with patient at bedside and patient agreed to plan.   Labs Review Labs Reviewed - No data to display  Imaging Review No results found.   EKG Interpretation None      MDM   Final diagnoses:  Finger amputation, traumatic, initial encounter    Patient with finger tip amputation. Digital block applied and wound cleansed thoroughly. Given PO Keflex. Discussed with Dr. Caralyn Guile who will see the patient in the office tomorrow.  Tdap updated. D/c with pain medication and keflex Patient seen in shared visit with attending physician. I personally reviewed the imaging tests through PACS system. I have reviewed and interpreted Lab values. I reviewed available ER/hospitalization records through the EMR    I personally performed the services described in this documentation, which was scribed in my presence. The recorded information has been reviewed and is accurate.      Margarita Mail, PA-C 10/18/14 [phone]

## 2014-10-17 NOTE — ED Provider Notes (Signed)
Medical screening examination/treatment/procedure(s) were conducted as a shared visit with non-physician practitioner(s) and myself.  I personally evaluated the patient during the encounter.   EKG Interpretation None      Pt is a RHD 80 y.o. male who has a distal amputation of the left fourth digit. Tetanus up-to-date. Wound cleaned and covered. We'll have him follow up with Dr. Caralyn Guile as an outpatient. Will discharge on antibiotics and with pain medication. No other injury. Otherwise neurovascular intact distally.  [place], DO 10/17/14 1426

## 2014-10-17 NOTE — ED Notes (Signed)
Pt c/o partial finger amputation of Left 4th digit.  Pain score 5/10.  Pt reports getting finger caught between a pulley and a belt.  Finger tip could not be found.

## 2014-10-18 ENCOUNTER — Ambulatory Visit (HOSPITAL_COMMUNITY)
Admission: AD | Admit: 2014-10-18 | Discharge: 2014-10-18 | Disposition: A | Discharge status: Home / Self Care | Payer: Medicare Other | Source: Ambulatory Visit | Attending: Orthopedic Surgery | Admitting: Orthopedic Surgery

## 2014-10-18 ENCOUNTER — Ambulatory Visit (HOSPITAL_COMMUNITY): Payer: Medicare Other | Admitting: Certified Registered Nurse Anesthetist

## 2014-10-18 ENCOUNTER — Encounter (HOSPITAL_COMMUNITY)
Admission: AD | Disposition: A | Discharge status: Home / Self Care | Payer: Self-pay | Source: Ambulatory Visit | Attending: Orthopedic Surgery

## 2014-10-18 ENCOUNTER — Encounter (HOSPITAL_COMMUNITY): Payer: Self-pay | Admitting: *Deleted

## 2014-10-18 DIAGNOSIS — K2270 Barrett's esophagus without dysplasia: Secondary | ICD-10-CM | POA: Insufficient documentation

## 2014-10-18 DIAGNOSIS — J45909 Unspecified asthma, uncomplicated: Secondary | ICD-10-CM | POA: Insufficient documentation

## 2014-10-18 DIAGNOSIS — I10 Essential (primary) hypertension: Secondary | ICD-10-CM | POA: Insufficient documentation

## 2014-10-18 DIAGNOSIS — S68125A Partial traumatic metacarpophalangeal amputation of left ring finger, initial encounter: Secondary | ICD-10-CM | POA: Insufficient documentation

## 2014-10-18 DIAGNOSIS — K449 Diaphragmatic hernia without obstruction or gangrene: Secondary | ICD-10-CM | POA: Insufficient documentation

## 2014-10-18 DIAGNOSIS — X58XXXA Exposure to other specified factors, initial encounter: Secondary | ICD-10-CM | POA: Diagnosis not present

## 2014-10-18 DIAGNOSIS — S68115A Complete traumatic metacarpophalangeal amputation of left ring finger, initial encounter: Secondary | ICD-10-CM | POA: Diagnosis present

## 2014-10-18 DIAGNOSIS — K219 Gastro-esophageal reflux disease without esophagitis: Secondary | ICD-10-CM | POA: Insufficient documentation

## 2014-10-18 DIAGNOSIS — Z86010 Personal history of colonic polyps: Secondary | ICD-10-CM | POA: Diagnosis not present

## 2014-10-18 DIAGNOSIS — Z96651 Presence of right artificial knee joint: Secondary | ICD-10-CM | POA: Insufficient documentation

## 2014-10-18 DIAGNOSIS — Y990 Civilian activity done for income or pay: Secondary | ICD-10-CM | POA: Insufficient documentation

## 2014-10-18 DIAGNOSIS — K222 Esophageal obstruction: Secondary | ICD-10-CM | POA: Diagnosis not present

## 2014-10-18 DIAGNOSIS — M1990 Unspecified osteoarthritis, unspecified site: Secondary | ICD-10-CM | POA: Diagnosis not present

## 2014-10-18 DIAGNOSIS — Z880 Allergy status to penicillin: Secondary | ICD-10-CM | POA: Insufficient documentation

## 2014-10-18 DIAGNOSIS — Y9269 Other specified industrial and construction area as the place of occurrence of the external cause: Secondary | ICD-10-CM | POA: Diagnosis not present

## 2014-10-18 HISTORY — PX: AMPUTATION: SHX166

## 2014-10-18 LAB — CBC
HEMATOCRIT: 42.6 % (ref 39.0–52.0)
Hemoglobin: 14.8 g/dL (ref 13.0–17.0)
MCH: 30.3 pg (ref 26.0–34.0)
MCHC: 34.7 g/dL (ref 30.0–36.0)
MCV: 87.3 fL (ref 78.0–100.0)
Platelets: 192 10*3/uL (ref 150–400)
RBC: 4.88 MIL/uL (ref 4.22–5.81)
RDW: 13.5 % (ref 11.5–15.5)
WBC: 10.5 10*3/uL (ref 4.0–10.5)

## 2014-10-18 LAB — BASIC METABOLIC PANEL
Anion gap: 8 (ref 5–15)
BUN: 17 mg/dL (ref 6–20)
CALCIUM: 8.7 mg/dL — AB (ref 8.9–10.3)
CO2: 21 mmol/L — AB (ref 22–32)
Chloride: 105 mmol/L (ref 101–111)
Creatinine, Ser: 0.76 mg/dL (ref 0.61–1.24)
GFR calc Af Amer: >60 mL/min (ref >60)
Glucose, Bld: 116 mg/dL — ABNORMAL HIGH (ref 70–99)
Potassium: 3.9 mmol/L (ref 3.5–5.1)
Sodium: 134 mmol/L — ABNORMAL LOW (ref 135–145)

## 2014-10-18 SURGERY — AMPUTATION DIGIT
Anesthesia: General | Site: Hand | Laterality: Left

## 2014-10-18 MED ORDER — LIDOCAINE HCL (CARDIAC) 20 MG/ML IV SOLN
INTRAVENOUS | Status: DC | PRN
  Administered 2014-10-18: 40 mg via INTRAVENOUS

## 2014-10-18 MED ORDER — PROMETHAZINE HCL 25 MG/ML IJ SOLN: 6.2500 mg | INTRAMUSCULAR | Status: DC | PRN

## 2014-10-18 MED ORDER — PROPOFOL 10 MG/ML IV BOLUS
INTRAVENOUS | Status: DC | PRN
  Administered 2014-10-18: 190 mg via INTRAVENOUS

## 2014-10-18 MED ORDER — FENTANYL CITRATE (PF) 250 MCG/5ML IJ SOLN
INTRAMUSCULAR | Status: AC
  Filled 2014-10-18: qty 5

## 2014-10-18 MED ORDER — BUPIVACAINE HCL (PF) 0.25 % IJ SOLN
INTRAMUSCULAR | Status: DC | PRN
  Administered 2014-10-18: 6 mL

## 2014-10-18 MED ORDER — CLINDAMYCIN PHOSPHATE 900 MG/50ML IV SOLN
900.0000 mg | INTRAVENOUS | Status: AC
  Administered 2014-10-18: 900 mg via INTRAVENOUS
  Filled 2014-10-18: qty 50

## 2014-10-18 MED ORDER — LACTATED RINGERS IV SOLN: INTRAVENOUS | Status: DC

## 2014-10-18 MED ORDER — MEPERIDINE HCL 25 MG/ML IJ SOLN: 6.2500 mg | INTRAMUSCULAR | Status: DC | PRN

## 2014-10-18 MED ORDER — CLINDAMYCIN HCL 300 MG PO CAPS: 300.0000 mg | ORAL_CAPSULE | Freq: Three times a day (TID) | ORAL | Status: DC

## 2014-10-18 MED ORDER — ONDANSETRON HCL 4 MG/2ML IJ SOLN
INTRAMUSCULAR | Status: DC | PRN
  Administered 2014-10-18: 4 mg via INTRAVENOUS

## 2014-10-18 MED ORDER — HYDROMORPHONE HCL 1 MG/ML IJ SOLN: 0.2500 mg | INTRAMUSCULAR | Status: DC | PRN

## 2014-10-18 MED ORDER — LACTATED RINGERS IV SOLN
INTRAVENOUS | Status: DC
  Administered 2014-10-18 (×2): via INTRAVENOUS

## 2014-10-18 MED ORDER — PHENYLEPHRINE HCL 10 MG/ML IJ SOLN
INTRAMUSCULAR | Status: DC | PRN
  Administered 2014-10-18: 80 ug via INTRAVENOUS
  Administered 2014-10-18 (×7): 40 ug via INTRAVENOUS

## 2014-10-18 MED ORDER — EPHEDRINE SULFATE 50 MG/ML IJ SOLN
INTRAMUSCULAR | Status: DC | PRN
  Administered 2014-10-18: 5 mg via INTRAVENOUS
  Administered 2014-10-18 (×2): 10 mg via INTRAVENOUS
  Administered 2014-10-18 (×2): 5 mg via INTRAVENOUS

## 2014-10-18 MED ORDER — 0.9 % SODIUM CHLORIDE (POUR BTL) OPTIME
TOPICAL | Status: DC | PRN
  Administered 2014-10-18: 1000 mL

## 2014-10-18 MED ORDER — GLYCOPYRROLATE 0.2 MG/ML IJ SOLN
INTRAMUSCULAR | Status: DC | PRN
  Administered 2014-10-18: .2 mg via INTRAVENOUS

## 2014-10-18 MED ORDER — FENTANYL CITRATE (PF) 100 MCG/2ML IJ SOLN
INTRAMUSCULAR | Status: DC | PRN
  Administered 2014-10-18: 25 ug via INTRAVENOUS
  Administered 2014-10-18: 100 ug via INTRAVENOUS

## 2014-10-18 MED ORDER — BUPIVACAINE HCL (PF) 0.25 % IJ SOLN
INTRAMUSCULAR | Status: AC
  Filled 2014-10-18: qty 30

## 2014-10-18 MED ORDER — DEXAMETHASONE SODIUM PHOSPHATE 4 MG/ML IJ SOLN: INTRAMUSCULAR | Status: DC | PRN

## 2014-10-18 MED ORDER — CHLORHEXIDINE GLUCONATE 4 % EX LIQD
60.0000 mL | Freq: Once | CUTANEOUS | Status: DC
Start: 2014-10-18 — End: 2014-10-18
  Filled 2014-10-18: qty 60

## 2014-10-18 SURGICAL SUPPLY — 44 items
BANDAGE ELASTIC 3 VELCRO ST LF (GAUZE/BANDAGES/DRESSINGS) IMPLANT
BANDAGE ELASTIC 4 VELCRO ST LF (GAUZE/BANDAGES/DRESSINGS) IMPLANT
BNDG CMPR MD 5X2 ELC HKLP STRL (GAUZE/BANDAGES/DRESSINGS) ×1
BNDG COHESIVE 1X5 TAN STRL LF (GAUZE/BANDAGES/DRESSINGS) ×2 IMPLANT
BNDG CONFORM 2 STRL LF (GAUZE/BANDAGES/DRESSINGS) ×1 IMPLANT
BNDG ELASTIC 2 VLCR STRL LF (GAUZE/BANDAGES/DRESSINGS) ×2 IMPLANT
BNDG GAUZE ELAST 4 BULKY (GAUZE/BANDAGES/DRESSINGS) ×2 IMPLANT
CORDS BIPOLAR (ELECTRODE) ×2 IMPLANT
COVER SURGICAL LIGHT HANDLE (MISCELLANEOUS) ×2 IMPLANT
CUFF TOURNIQUET SINGLE 18IN (TOURNIQUET CUFF) ×2 IMPLANT
CUFF TOURNIQUET SINGLE 24IN (TOURNIQUET CUFF) IMPLANT
DRAPE SURG 17X23 STRL (DRAPES) ×2 IMPLANT
DRSG ADAPTIC 3X8 NADH LF (GAUZE/BANDAGES/DRESSINGS) ×2 IMPLANT
DRSG EMULSION OIL 3X3 NADH (GAUZE/BANDAGES/DRESSINGS) ×1 IMPLANT
GAUZE SPONGE 2X2 8PLY STRL LF (GAUZE/BANDAGES/DRESSINGS) IMPLANT
GAUZE SPONGE 4X4 12PLY STRL (GAUZE/BANDAGES/DRESSINGS) IMPLANT
GLOVE BIOGEL PI IND STRL 8.5 (GLOVE) ×1 IMPLANT
GLOVE BIOGEL PI INDICATOR 8.5 (GLOVE) ×1
GLOVE SURG ORTHO 8.0 STRL STRW (GLOVE) ×2 IMPLANT
GOWN STRL REUS W/ TWL LRG LVL3 (GOWN DISPOSABLE) ×2 IMPLANT
GOWN STRL REUS W/ TWL XL LVL3 (GOWN DISPOSABLE) ×1 IMPLANT
GOWN STRL REUS W/TWL LRG LVL3 (GOWN DISPOSABLE) ×4
GOWN STRL REUS W/TWL XL LVL3 (GOWN DISPOSABLE) ×2
KIT BASIN OR (CUSTOM PROCEDURE TRAY) ×2 IMPLANT
KIT ROOM TURNOVER OR (KITS) ×2 IMPLANT
MANIFOLD NEPTUNE II (INSTRUMENTS) ×2 IMPLANT
NDL HYPO 25GX1X1/2 BEV (NEEDLE) IMPLANT
NEEDLE HYPO 25GX1X1/2 BEV (NEEDLE) IMPLANT
NS IRRIG 1000ML POUR BTL (IV SOLUTION) ×2 IMPLANT
PACK ORTHO EXTREMITY (CUSTOM PROCEDURE TRAY) ×2 IMPLANT
PAD ARMBOARD 7.5X6 YLW CONV (MISCELLANEOUS) ×4 IMPLANT
PAD CAST 4YDX4 CTTN HI CHSV (CAST SUPPLIES) IMPLANT
PADDING CAST COTTON 4X4 STRL (CAST SUPPLIES)
SOAP 2 % CHG 4 OZ (WOUND CARE) ×2 IMPLANT
SPECIMEN JAR SMALL (MISCELLANEOUS) ×2 IMPLANT
SPONGE GAUZE 2X2 STER 10/PKG (GAUZE/BANDAGES/DRESSINGS) ×1
SUCTION FRAZIER TIP 10 FR DISP (SUCTIONS) IMPLANT
SUT MERSILENE 4 0 P 3 (SUTURE) IMPLANT
SUT PROLENE 4 0 PS 2 18 (SUTURE) IMPLANT
SYR CONTROL 10ML LL (SYRINGE) IMPLANT
TOWEL OR 17X24 6PK STRL BLUE (TOWEL DISPOSABLE) ×2 IMPLANT
TOWEL OR 17X26 10 PK STRL BLUE (TOWEL DISPOSABLE) ×2 IMPLANT
TUBE CONNECTING 12X1/4 (SUCTIONS) IMPLANT
WATER STERILE IRR 1000ML POUR (IV SOLUTION) ×2 IMPLANT

## 2014-10-18 NOTE — Transfer of Care (Signed)
Immediate Anesthesia Transfer of Care Note  Patient: Angel Hatfield  Procedure(s) Performed: Procedure(s): RING FINGER REVISION AMPUTATION WITH LOCAL NEURECTOMIES (Left)  Patient Location: PACU  Anesthesia Type:General  Level of Consciousness: awake, alert  and oriented  Airway & Oxygen Therapy: Patient Spontanous Breathing  Post-op Assessment: Report given to RN and Post -op Vital signs reviewed and stable  Post vital signs: Reviewed and stable  Last Vitals:  Filed Vitals:   10/18/14 1440  BP: 119/68  Pulse: 65  Temp: 36.1 C  Resp: 16    Complications: No apparent anesthesia complications

## 2014-10-18 NOTE — Discharge Instructions (Signed)
KEEP BANDAGE CLEAN AND DRY CALL OFFICE FOR F/U APPT [phone] Dr Caralyn Guile [phone] KEEP HAND ELEVATED ABOVE HEART OK TO APPLY ICE TO OPERATIVE AREA CONTACT OFFICE IF ANY WORSENING PAIN OR CONCERNS.

## 2014-10-18 NOTE — H&P (Signed)
Angel Hatfield is an 80 y.o. male.   Chief Complaint: LEFT RING FINGER AMPUTATION HPI: Angel Hatfield is a 80 y.o. male who presents to the Emergency Department with partial amputation of distal fourth left finger incurred at work today approximately one hour ago. Patient reports trapping his finger in pulley system/belt severing his finger just distal to DIP joint. The detached finger has not been found despite searching. Patient with PMHx of Hypertension states that he is not on anticoagulants.  Patient is right hand dominant. PT SEEN AND EVALUATED IN OFFICE TODAY  Past Medical History  Diagnosis Date  . Stricture of esophagus   . Esophageal dysmotility   . Barrett's esophagus 2006  . Adenomatous colon polyp   . GERD (gastroesophageal reflux disease)   . Asthma   . Hiatal hernia   . Arthritis   . Visual impairment   . Hearing impairment   . HTN (hypertension)     Past Surgical History  Procedure Laterality Date  . Total knee arthroplasty      right  . Inner ear surgery    . Scrotal surgery    . Tonsillectomy and adenoidectomy    . Colonoscopy  multiple  . Upper gastrointestinal endoscopy  multiple    Family History  Problem Relation Age of Onset  . Stroke Mother   . Heart failure Father   . Emphysema Father   . Alzheimer's disease Mother    Social History:  reports that he has never smoked. He has never used smokeless tobacco. He reports that he drinks about 3.5 oz of alcohol per week. He reports that he does not use illicit drugs.  Allergies:  Allergies  Allergen Reactions  . Penicillins     Tongue sweeling    Medications Prior to Admission  Medication Sig Dispense Refill  . cephALEXin (KEFLEX) 500 MG capsule Take 1 capsule (500 mg total) by mouth 4 (four) times daily. 20 capsule 0  . lisinopril-hydrochlorothiazide (PRINZIDE,ZESTORETIC) 10-12.5 MG per tablet Take 0.5 tablets by mouth daily.    . traMADol (ULTRAM) 50 MG tablet Take 1 tablet (50 mg total) by  mouth every 6 (six) hours as needed. 15 tablet 0  . traZODone (DESYREL) 100 MG tablet Take 100 mg by mouth at bedtime as needed for sleep.       No results found for this or any previous visit (from the past 48 hour(s)). Dg Hand Complete Left  10/17/2014   CLINICAL DATA:  80 year old male status post partial amputation of the fourth finger today by pole E/belt. Initial encounter.  EXAM: LEFT HAND - COMPLETE 3+ VIEW  COMPARISON:  Left hand series 5 10/2009.  FINDINGS: Traumatic amputation of the distal aspect of the left fourth finger leaving only small 1-2 mm length segment of the fourth distal phalanx. The fourth DIP appears to remain intact, with no definite fracture of the fourth middle phalanx.  Other phalanges appear intact. Questionable nondisplaced fracture through the head of the third metacarpal (arrow).  Superimposed widespread distal joint space degeneration and chronic severe radiocarpal and left thumb basal joint degeneration. Chronic increased scapholunate distance and associated chronically abnormal carpal bone alignment. Scaphoid open lunate sclerosis. Distal radius and ulna appear intact.  IMPRESSION: 1. Traumatic amputation of the distal left fourth finger leaving only a small remnant of the fourth distal phalanx. No other acute fracture in the fourth ray identified. 2. Nondisplaced fracture of the third metacarpal head versus artifact. 3. Underlying chronic hand and wrist degeneration.  Electronically Signed   By: Genevie Ann M.D.   On: 10/17/2014 13:18    ROS NO RECENT ILLNESSES OR HOSPITALIZATIONS  Blood pressure 119/68, pulse 65, temperature 97 F (36.1 C), temperature source Oral, resp. rate 16, height 5\' 8"  (1.727 m), weight 79.379 kg (175 lb), SpO2 100 %. Physical Exam  General Appearance:  Alert, cooperative, no distress, appears stated age  Head:  Normocephalic, without obvious abnormality, atraumatic  Eyes:  Pupils equal, conjunctiva/corneas clear,         Throat: Lips,  mucosa, and tongue normal; teeth and gums normal  Neck: No visible masses     Lungs:   respirations unlabored  Chest Wall:  No tenderness or deformity  Heart:  Regular rate and rhythm,  Abdomen:   Soft, non-tender,         Extremities: LEFT HAND: BANDAGE IN PLACE EXAMINED IN OFFICE TODAY PT WITH OPEN WOUND AND EXPOSED BONE  Pulses: 2+ and symmetric  Skin: Skin color, texture, turgor normal, no rashes or lesions     Neurologic: Normal    Assessment/Plan LEFT RING FINGER AMPUTATION WITH EXPOSED DISTAL PHALANX  LEFT RING FINGER REVISION AMPUTATION WITH LOCAL NEURECTOMIES AND ADVANCEMENT FLAP CLOSURE R/B/A DISCUSSED WITH PT IN OFFICE.  PT VOICED UNDERSTANDING OF PLAN CONSENT SIGNED DAY OF SURGERY PT SEEN AND EXAMINED PRIOR TO OPERATIVE PROCEDURE/DAY OF SURGERY SITE MARKED. QUESTIONS ANSWERED WILL GO HOME FOLLOWING SURGERY  WE ARE PLANNING SURGERY FOR YOUR UPPER EXTREMITY. THE RISKS AND BENEFITS OF SURGERY INCLUDE BUT NOT LIMITED TO BLEEDING INFECTION, DAMAGE TO NEARBY NERVES ARTERIES TENDONS, FAILURE OF SURGERY TO ACCOMPLISH ITS INTENDED GOALS, PERSISTENT SYMPTOMS AND NEED FOR FURTHER SURGICAL INTERVENTION. WITH THIS IN MIND WE WILL PROCEED. I HAVE DISCUSSED WITH THE PATIENT THE PRE AND POSTOPERATIVE REGIMEN AND THE DOS AND DON'TS. PT VOICED UNDERSTANDING AND INFORMED CONSENT SIGNED.  Linna Hoff 10/18/2014, 3:03 PM
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
## 2014-10-18 NOTE — Brief Op Note (Signed)
10/18/2014  3:05 PM  PATIENT:  Ivan Anchors  80 y.o. male  PRE-OPERATIVE DIAGNOSIS:  FINGER AMPUTATION  POST-OPERATIVE DIAGNOSIS:  * No post-op diagnosis entered *  PROCEDURE:  Procedure(s): RING FINGER REVISION AMPUTATION WITH LOCAL NEURECTOMIES (Left)  SURGEON:  Surgeon(s) and Role:    * Iran Planas, MD - Primary  PHYSICIAN ASSISTANT:   ASSISTANTS: none   ANESTHESIA:   general  EBL:     BLOOD ADMINISTERED:none  DRAINS: none   LOCAL MEDICATIONS USED:  MARCAINE     SPECIMEN:  No Specimen  DISPOSITION OF SPECIMEN:  N/A  COUNTS:  YES  TOURNIQUET:    DICTATION: .Other Dictation: Dictation Number [id number]  PLAN OF CARE: Discharge to home after PACU  PATIENT DISPOSITION:  PACU - hemodynamically stable.   Delay start of Pharmacological VTE agent (>24hrs) due to surgical blood loss or risk of bleeding: not applicable

## 2014-10-18 NOTE — Anesthesia Procedure Notes (Signed)
Procedure Name: LMA Insertion Date/Time: 10/18/2014 5:27 PM Performed by: Merdis Delay Pre-anesthesia Checklist: Patient identified, Timeout performed, Emergency Drugs available, Suction available and Patient being monitored Patient Re-evaluated:Patient Re-evaluated prior to inductionOxygen Delivery Method: Circle system utilized Preoxygenation: Pre-oxygenation with 100% oxygen LMA: LMA inserted LMA Size: 5.0 Number of attempts: 1 Placement Confirmation: positive ETCO2,  CO2 detector and breath sounds checked- equal and bilateral Tube secured with: Tape Dental Injury: Teeth and Oropharynx as per pre-operative assessment

## 2014-10-18 NOTE — Anesthesia Postprocedure Evaluation (Signed)
  Anesthesia Post-op Note  Patient: Angel Hatfield  Procedure(s) Performed: Procedure(s): RING FINGER REVISION AMPUTATION WITH LOCAL NEURECTOMIES (Left)  Patient Location: PACU  Anesthesia Type:General  Level of Consciousness: awake, alert  and oriented  Airway and Oxygen Therapy: Patient Spontanous Breathing and Patient connected to nasal cannula oxygen  Post-op Pain: none  Post-op Assessment: Post-op Vital signs reviewed, Patient's Cardiovascular Status Stable, Respiratory Function Stable, Patent Airway and Pain level controlled  Post-op Vital Signs: stable  Last Vitals:  Filed Vitals:   10/18/14 1835  BP: 116/66  Pulse: 68  Temp: 36.1 C  Resp: 18    Complications: No apparent anesthesia complications

## 2014-10-19 NOTE — Op Note (Signed)
NAMEDINO, BORNTREGER NO.:  [id number]  MEDICAL RECORD NO.:  [id number]  LOCATION:  [facility]                         FACILITY:  [facility]  PHYSICIAN:  Linna Hoff IV, M.D.DATE OF BIRTH:  March 20, 1935  DATE OF PROCEDURE:  10/18/2014 DATE OF DISCHARGE:  10/18/2014                              OPERATIVE REPORT   PREOPERATIVE DIAGNOSIS:  Left ring finger open distal phalanx fracture with distal amputation.  POSTOPERATIVE DIAGNOSIS:  Left ring finger open distal phalanx fracture with distal amputation.  ATTENDING PHYSICIAN:  Linna Hoff, M.D., who was scrubbed and present for the entire procedure.  ASSISTANT SURGEON:  None.  ANESTHESIA:  General via LMA.  SURGICAL PROCEDURES: 1. Left ring finger debridement of skin, subcutaneous tissue, and bone     associated with open fracture, distal phalanx. 2. Left ring finger revision amputation through the level of the     distal interphalangeal joint with local neurectomies and     advancement flap closure.  SURGICAL INDICATIONS:  Angel Hatfield is a right-hand-dominant gentleman, who sustained an injury to his left ring finger, sustained a traumatic wound amputation.  The patient was seen and evaluated in the office and recommended to undergo the above procedure.  Risks, benefits, and alternatives were discussed in detail with the patient and signed informed consent was obtained.  Risks include, but not limited to bleeding; infection; damage to nearby nerves, arteries, or tendons; loss motion of wrist and digits, incomplete relief of symptoms; and need for further surgical intervention.  DESCRIPTION OF PROCEDURE:  The patient was properly identified in the preoperative holding area and marked with a permanent marker made on left ring finger to indicate the correct operative site.  The patient was then brought back to the operating room, placed supine on anesthesia table.  General anesthesia was administered.  The  patient tolerated this well.  Preoperative antibiotics given prior to any skin incision.  The left upper extremity was then prepped and draped in normal sterile fashion.  A well-padded tourniquet placed on the right brachium and sealed with a 1000 drape.  Time-out was called.  The correct site was identified, and the procedure was then begun.  Excisional debridement was then carried out of the skin, subcutaneous tissue, and bone associated with the open fracture.  Rongeur, curettes were then used to remove the small bone fragments of the distal phalanx.  The wound was then thoroughly irrigated.  Devitalized tissue was then removed.  This was all done sharply with a knife and scissors.  After excisional debridement, amputation was then carried out to the level of the distal interphalangeal joint.  The wound was then thoroughly irrigated.  The local neurectomies were then carried out.  Given the soft tissue defect, the dorsal skin was then advanced in a V-Y fashion in order to close the wound.  The advancement flap closure was then carried out.  The skin was then closed using simple 4-0 Prolene sutures.  A 7 mL of 0.25% Marcaine infiltrated locally.  Sterile compressive bandage was then applied.  The patient tolerated the procedure well, returned to recovery room in good condition.  POSTPROCEDURE PLAN:  The patient was discharged to home,  seen back in the office in approximately 7 days for wound check and sutures out around the 2-3 week mark.  No x-rays at the first visit.     Angel Hatfield, M.D.     FWO/MEDQ  D:  10/18/2014  T:  10/19/2014  Job:  [id number]

## 2014-10-22 ENCOUNTER — Encounter (HOSPITAL_COMMUNITY): Payer: Self-pay | Admitting: Orthopedic Surgery

## 2014-10-23 ENCOUNTER — Encounter: Payer: Self-pay | Admitting: Internal Medicine

## 2015-03-04 ENCOUNTER — Encounter: Payer: Self-pay | Admitting: Internal Medicine

## 2017-11-27 ENCOUNTER — Encounter (HOSPITAL_COMMUNITY)
Admission: EM | Disposition: A | Discharge status: Home Health | Payer: Self-pay | Source: Home / Self Care | Attending: Neurosurgery

## 2017-11-27 ENCOUNTER — Other Ambulatory Visit: Payer: Self-pay

## 2017-11-27 ENCOUNTER — Emergency Department (HOSPITAL_COMMUNITY): Payer: Medicare Other | Admitting: Anesthesiology

## 2017-11-27 ENCOUNTER — Emergency Department (HOSPITAL_COMMUNITY): Payer: Medicare Other

## 2017-11-27 ENCOUNTER — Encounter (HOSPITAL_COMMUNITY): Payer: Self-pay | Admitting: Emergency Medicine

## 2017-11-27 ENCOUNTER — Inpatient Hospital Stay (HOSPITAL_COMMUNITY)
Admission: EM | Admit: 2017-11-27 | Discharge: 2017-12-03 | DRG: 025 | Disposition: A | Discharge status: Home Health | Payer: Medicare Other | Attending: Neurosurgery | Admitting: Neurosurgery

## 2017-11-27 DIAGNOSIS — Z86010 Personal history of colonic polyps: Secondary | ICD-10-CM

## 2017-11-27 DIAGNOSIS — K2270 Barrett's esophagus without dysplasia: Secondary | ICD-10-CM | POA: Diagnosis present

## 2017-11-27 DIAGNOSIS — G8194 Hemiplegia, unspecified affecting left nondominant side: Secondary | ICD-10-CM | POA: Diagnosis present

## 2017-11-27 DIAGNOSIS — H9190 Unspecified hearing loss, unspecified ear: Secondary | ICD-10-CM | POA: Diagnosis present

## 2017-11-27 DIAGNOSIS — S065XAA Traumatic subdural hemorrhage with loss of consciousness status unknown, initial encounter: Secondary | ICD-10-CM | POA: Diagnosis present

## 2017-11-27 DIAGNOSIS — Z820 Family history of epilepsy and other diseases of the nervous system: Secondary | ICD-10-CM | POA: Diagnosis not present

## 2017-11-27 DIAGNOSIS — Z79899 Other long term (current) drug therapy: Secondary | ICD-10-CM

## 2017-11-27 DIAGNOSIS — W19XXXA Unspecified fall, initial encounter: Secondary | ICD-10-CM | POA: Diagnosis present

## 2017-11-27 DIAGNOSIS — I10 Essential (primary) hypertension: Secondary | ICD-10-CM | POA: Diagnosis present

## 2017-11-27 DIAGNOSIS — Z880 Allergy status to penicillin: Secondary | ICD-10-CM

## 2017-11-27 DIAGNOSIS — K449 Diaphragmatic hernia without obstruction or gangrene: Secondary | ICD-10-CM | POA: Diagnosis present

## 2017-11-27 DIAGNOSIS — K219 Gastro-esophageal reflux disease without esophagitis: Secondary | ICD-10-CM | POA: Diagnosis present

## 2017-11-27 DIAGNOSIS — Z96651 Presence of right artificial knee joint: Secondary | ICD-10-CM | POA: Diagnosis present

## 2017-11-27 DIAGNOSIS — R296 Repeated falls: Secondary | ICD-10-CM | POA: Diagnosis present

## 2017-11-27 DIAGNOSIS — J45909 Unspecified asthma, uncomplicated: Secondary | ICD-10-CM | POA: Diagnosis present

## 2017-11-27 DIAGNOSIS — Z8249 Family history of ischemic heart disease and other diseases of the circulatory system: Secondary | ICD-10-CM

## 2017-11-27 DIAGNOSIS — S065X0A Traumatic subdural hemorrhage without loss of consciousness, initial encounter: Secondary | ICD-10-CM | POA: Diagnosis present

## 2017-11-27 DIAGNOSIS — M1990 Unspecified osteoarthritis, unspecified site: Secondary | ICD-10-CM | POA: Diagnosis present

## 2017-11-27 DIAGNOSIS — R402362 Coma scale, best motor response, obeys commands, at arrival to emergency department: Secondary | ICD-10-CM | POA: Diagnosis present

## 2017-11-27 DIAGNOSIS — Z823 Family history of stroke: Secondary | ICD-10-CM

## 2017-11-27 DIAGNOSIS — R402142 Coma scale, eyes open, spontaneous, at arrival to emergency department: Secondary | ICD-10-CM | POA: Diagnosis present

## 2017-11-27 DIAGNOSIS — Z9889 Other specified postprocedural states: Secondary | ICD-10-CM

## 2017-11-27 DIAGNOSIS — Z825 Family history of asthma and other chronic lower respiratory diseases: Secondary | ICD-10-CM | POA: Diagnosis not present

## 2017-11-27 DIAGNOSIS — S061X0A Traumatic cerebral edema without loss of consciousness, initial encounter: Secondary | ICD-10-CM | POA: Diagnosis present

## 2017-11-27 DIAGNOSIS — Z89022 Acquired absence of left finger(s): Secondary | ICD-10-CM

## 2017-11-27 DIAGNOSIS — S065X9A Traumatic subdural hemorrhage with loss of consciousness of unspecified duration, initial encounter: Secondary | ICD-10-CM

## 2017-11-27 DIAGNOSIS — Z792 Long term (current) use of antibiotics: Secondary | ICD-10-CM | POA: Diagnosis not present

## 2017-11-27 DIAGNOSIS — H547 Unspecified visual loss: Secondary | ICD-10-CM | POA: Diagnosis present

## 2017-11-27 DIAGNOSIS — Z79891 Long term (current) use of opiate analgesic: Secondary | ICD-10-CM | POA: Diagnosis not present

## 2017-11-27 DIAGNOSIS — R402242 Coma scale, best verbal response, confused conversation, at arrival to emergency department: Secondary | ICD-10-CM | POA: Diagnosis present

## 2017-11-27 HISTORY — PX: CRANIOTOMY: SHX93

## 2017-11-27 LAB — TYPE AND SCREEN
ABO/RH(D): O POS
Antibody Screen: NEGATIVE

## 2017-11-27 LAB — BASIC METABOLIC PANEL
Anion gap: 11 (ref 5–15)
BUN: 17 mg/dL (ref 6–20)
CHLORIDE: 101 mmol/L (ref 101–111)
CO2: 21 mmol/L — ABNORMAL LOW (ref 22–32)
Calcium: 9.2 mg/dL (ref 8.9–10.3)
Creatinine, Ser: 0.92 mg/dL (ref 0.61–1.24)
GFR calc Af Amer: >60 mL/min (ref >60)
Glucose, Bld: 144 mg/dL — ABNORMAL HIGH (ref 65–99)
POTASSIUM: 3.1 mmol/L — AB (ref 3.5–5.1)
SODIUM: 133 mmol/L — AB (ref 135–145)

## 2017-11-27 LAB — CBC
HEMATOCRIT: 43.2 % (ref 39.0–52.0)
Hemoglobin: 13.7 g/dL (ref 13.0–17.0)
MCH: 27.4 pg (ref 26.0–34.0)
MCHC: 31.7 g/dL (ref 30.0–36.0)
MCV: 86.4 fL (ref 78.0–100.0)
Platelets: 276 10*3/uL (ref 150–400)
RBC: 5 MIL/uL (ref 4.22–5.81)
RDW: 14.2 % (ref 11.5–15.5)
WBC: 9.7 10*3/uL (ref 4.0–10.5)

## 2017-11-27 LAB — ABO/RH: ABO/RH(D): O POS

## 2017-11-27 LAB — CBG MONITORING, ED: Glucose-Capillary: 150 mg/dL — ABNORMAL HIGH (ref 65–99)

## 2017-11-27 LAB — MRSA PCR SCREENING: MRSA BY PCR: NEGATIVE

## 2017-11-27 SURGERY — CRANIOTOMY HEMATOMA EVACUATION SUBDURAL
Anesthesia: General | Site: Head | Laterality: Bilateral

## 2017-11-27 MED ORDER — TRAMADOL HCL 50 MG PO TABS
50.0000 mg | ORAL_TABLET | Freq: Four times a day (QID) | ORAL | Status: DC | PRN
  Administered 2017-11-28 – 2017-12-02 (×3): 50 mg via ORAL
  Filled 2017-11-27 (×3): qty 1

## 2017-11-27 MED ORDER — PROPOFOL 10 MG/ML IV BOLUS
INTRAVENOUS | Status: AC
  Filled 2017-11-27: qty 40

## 2017-11-27 MED ORDER — ROCURONIUM BROMIDE 50 MG/5ML IV SOLN
INTRAVENOUS | Status: AC
  Filled 2017-11-27: qty 2

## 2017-11-27 MED ORDER — EPHEDRINE SULFATE 50 MG/ML IJ SOLN
INTRAMUSCULAR | Status: AC
  Filled 2017-11-27: qty 1

## 2017-11-27 MED ORDER — BUPIVACAINE-EPINEPHRINE 0.5% -1:200000 IJ SOLN
INTRAMUSCULAR | Status: DC | PRN
  Administered 2017-11-27: 10 mL

## 2017-11-27 MED ORDER — LISINOPRIL 5 MG PO TABS
5.0000 mg | ORAL_TABLET | Freq: Every day | ORAL | Status: DC
  Administered 2017-11-28 – 2017-12-03 (×6): 5 mg via ORAL
  Filled 2017-11-27 (×6): qty 1

## 2017-11-27 MED ORDER — HYDROCHLOROTHIAZIDE 10 MG/ML ORAL SUSPENSION
6.2500 mg | Freq: Every day | ORAL | Status: DC
  Administered 2017-11-28 – 2017-12-02 (×5): 6.25 mg via ORAL
  Filled 2017-11-27 (×8): qty 1.25

## 2017-11-27 MED ORDER — LISINOPRIL-HYDROCHLOROTHIAZIDE 10-12.5 MG PO TABS: 0.5000 | ORAL_TABLET | Freq: Every day | ORAL | Status: DC

## 2017-11-27 MED ORDER — SUGAMMADEX SODIUM 200 MG/2ML IV SOLN
INTRAVENOUS | Status: AC
  Filled 2017-11-27: qty 2

## 2017-11-27 MED ORDER — LEVETIRACETAM IN NACL 500 MG/100ML IV SOLN
500.0000 mg | Freq: Two times a day (BID) | INTRAVENOUS | Status: DC
  Administered 2017-11-28 – 2017-12-02 (×9): 500 mg via INTRAVENOUS
  Filled 2017-11-27 (×9): qty 100

## 2017-11-27 MED ORDER — PHENYLEPHRINE HCL 10 MG/ML IJ SOLN
INTRAVENOUS | Status: DC | PRN
  Administered 2017-11-27: 75 ug/min via INTRAVENOUS

## 2017-11-27 MED ORDER — FENTANYL CITRATE (PF) 100 MCG/2ML IJ SOLN
INTRAMUSCULAR | Status: DC | PRN
  Administered 2017-11-27: 50 ug via INTRAVENOUS

## 2017-11-27 MED ORDER — BISACODYL 10 MG RE SUPP
10.0000 mg | Freq: Every day | RECTAL | Status: DC | PRN
  Filled 2017-11-27: qty 1

## 2017-11-27 MED ORDER — SUCCINYLCHOLINE CHLORIDE 200 MG/10ML IV SOSY
PREFILLED_SYRINGE | INTRAVENOUS | Status: AC
  Filled 2017-11-27: qty 10

## 2017-11-27 MED ORDER — MORPHINE SULFATE (PF) 2 MG/ML IV SOLN
1.0000 mg | INTRAVENOUS | Status: DC | PRN
  Administered 2017-11-27 – 2017-11-28 (×2): 2 mg via INTRAVENOUS
  Filled 2017-11-27: qty 1

## 2017-11-27 MED ORDER — VANCOMYCIN HCL 1000 MG IV SOLR
INTRAVENOUS | Status: DC | PRN
  Administered 2017-11-27: 1000 mg via INTRAVENOUS

## 2017-11-27 MED ORDER — ONDANSETRON HCL 4 MG/2ML IJ SOLN
INTRAMUSCULAR | Status: DC | PRN
  Administered 2017-11-27: 4 mg via INTRAVENOUS

## 2017-11-27 MED ORDER — ROCURONIUM BROMIDE 100 MG/10ML IV SOLN
INTRAVENOUS | Status: DC | PRN
  Administered 2017-11-27: 30 mg via INTRAVENOUS

## 2017-11-27 MED ORDER — PROMETHAZINE HCL 25 MG PO TABS: 12.5000 mg | ORAL_TABLET | ORAL | Status: DC | PRN

## 2017-11-27 MED ORDER — VANCOMYCIN HCL IN DEXTROSE 750-5 MG/150ML-% IV SOLN
750.0000 mg | Freq: Two times a day (BID) | INTRAVENOUS | Status: AC
  Administered 2017-11-28 (×2): 750 mg via INTRAVENOUS
  Filled 2017-11-27 (×3): qty 150

## 2017-11-27 MED ORDER — POTASSIUM CHLORIDE IN NACL 20-0.9 MEQ/L-% IV SOLN
INTRAVENOUS | Status: DC
  Administered 2017-11-27 – 2017-12-02 (×8): via INTRAVENOUS
  Filled 2017-11-27 (×11): qty 1000

## 2017-11-27 MED ORDER — POLYETHYLENE GLYCOL 3350 17 G PO PACK: 17.0000 g | PACK | Freq: Every day | ORAL | Status: DC | PRN

## 2017-11-27 MED ORDER — MICROFIBRILLAR COLL HEMOSTAT EX PADS
MEDICATED_PAD | CUTANEOUS | Status: DC | PRN
  Administered 2017-11-27: 1 via TOPICAL

## 2017-11-27 MED ORDER — PHENYLEPHRINE 40 MCG/ML (10ML) SYRINGE FOR IV PUSH (FOR BLOOD PRESSURE SUPPORT)
PREFILLED_SYRINGE | INTRAVENOUS | Status: AC
  Filled 2017-11-27: qty 10

## 2017-11-27 MED ORDER — ONDANSETRON HCL 4 MG/2ML IJ SOLN
4.0000 mg | Freq: Once | INTRAMUSCULAR | Status: DC | PRN
Start: 2017-11-27 — End: 2017-11-27

## 2017-11-27 MED ORDER — SUCCINYLCHOLINE CHLORIDE 20 MG/ML IJ SOLN
INTRAMUSCULAR | Status: DC | PRN
  Administered 2017-11-27: 100 mg via INTRAVENOUS

## 2017-11-27 MED ORDER — SUGAMMADEX SODIUM 200 MG/2ML IV SOLN
INTRAVENOUS | Status: DC | PRN
  Administered 2017-11-27: 200 mg via INTRAVENOUS

## 2017-11-27 MED ORDER — ACETAMINOPHEN 650 MG RE SUPP
650.0000 mg | RECTAL | Status: DC | PRN
  Filled 2017-11-27: qty 1

## 2017-11-27 MED ORDER — THROMBIN 20000 UNITS EX SOLR
CUTANEOUS | Status: AC
  Filled 2017-11-27: qty 20000

## 2017-11-27 MED ORDER — FAMOTIDINE IN NACL 20-0.9 MG/50ML-% IV SOLN
20.0000 mg | Freq: Two times a day (BID) | INTRAVENOUS | Status: DC
  Administered 2017-11-27 – 2017-12-02 (×10): 20 mg via INTRAVENOUS
  Filled 2017-11-27 (×11): qty 50

## 2017-11-27 MED ORDER — LIDOCAINE-EPINEPHRINE 1 %-1:100000 IJ SOLN
INTRAMUSCULAR | Status: AC
  Filled 2017-11-27: qty 1

## 2017-11-27 MED ORDER — BACITRACIN ZINC 500 UNIT/GM EX OINT
TOPICAL_OINTMENT | CUTANEOUS | Status: AC
  Filled 2017-11-27: qty 28.35

## 2017-11-27 MED ORDER — PROPOFOL 10 MG/ML IV BOLUS
INTRAVENOUS | Status: DC | PRN
  Administered 2017-11-27: 140 mg via INTRAVENOUS

## 2017-11-27 MED ORDER — ACETAMINOPHEN 325 MG PO TABS
650.0000 mg | ORAL_TABLET | ORAL | Status: DC | PRN
  Administered 2017-11-29 – 2017-12-02 (×6): 650 mg via ORAL
  Filled 2017-11-27 (×6): qty 2

## 2017-11-27 MED ORDER — THROMBIN 20000 UNITS EX SOLR
CUTANEOUS | Status: DC | PRN
  Administered 2017-11-27: 20 mL via TOPICAL

## 2017-11-27 MED ORDER — BACITRACIN ZINC 500 UNIT/GM EX OINT
TOPICAL_OINTMENT | CUTANEOUS | Status: DC | PRN
  Administered 2017-11-27: 1 via TOPICAL

## 2017-11-27 MED ORDER — TRAZODONE HCL 100 MG PO TABS
100.0000 mg | ORAL_TABLET | Freq: Every evening | ORAL | Status: DC | PRN
  Administered 2017-11-28 – 2017-12-01 (×3): 100 mg via ORAL
  Filled 2017-11-27 (×4): qty 1

## 2017-11-27 MED ORDER — SODIUM CHLORIDE 0.9 % IJ SOLN
INTRAMUSCULAR | Status: AC
  Filled 2017-11-27: qty 20

## 2017-11-27 MED ORDER — FLEET ENEMA 7-19 GM/118ML RE ENEM: 1.0000 | ENEMA | Freq: Once | RECTAL | Status: DC | PRN

## 2017-11-27 MED ORDER — ONDANSETRON HCL 4 MG/2ML IJ SOLN
INTRAMUSCULAR | Status: AC
  Filled 2017-11-27: qty 2

## 2017-11-27 MED ORDER — THROMBIN 5000 UNITS EX SOLR
CUTANEOUS | Status: AC
  Filled 2017-11-27: qty 5000

## 2017-11-27 MED ORDER — LEVETIRACETAM IN NACL 500 MG/100ML IV SOLN
500.0000 mg | INTRAVENOUS | Status: AC
  Administered 2017-11-27: 500 mg via INTRAVENOUS
  Filled 2017-11-27: qty 100

## 2017-11-27 MED ORDER — VANCOMYCIN HCL IN DEXTROSE 1-5 GM/200ML-% IV SOLN
INTRAVENOUS | Status: AC
  Filled 2017-11-27: qty 200

## 2017-11-27 MED ORDER — LIDOCAINE 2% (20 MG/ML) 5 ML SYRINGE
INTRAMUSCULAR | Status: AC
  Filled 2017-11-27: qty 5

## 2017-11-27 MED ORDER — SODIUM CHLORIDE 0.9 % IV SOLN
INTRAVENOUS | Status: DC
  Administered 2017-11-27 (×2): via INTRAVENOUS

## 2017-11-27 MED ORDER — FENTANYL CITRATE (PF) 250 MCG/5ML IJ SOLN
INTRAMUSCULAR | Status: AC
  Filled 2017-11-27: qty 5

## 2017-11-27 MED ORDER — ONDANSETRON HCL 4 MG PO TABS: 4.0000 mg | ORAL_TABLET | ORAL | Status: DC | PRN

## 2017-11-27 MED ORDER — LABETALOL HCL 5 MG/ML IV SOLN: 10.0000 mg | INTRAVENOUS | Status: DC | PRN

## 2017-11-27 MED ORDER — DOCUSATE SODIUM 100 MG PO CAPS
100.0000 mg | ORAL_CAPSULE | Freq: Two times a day (BID) | ORAL | Status: DC
  Administered 2017-11-28 – 2017-12-03 (×7): 100 mg via ORAL
  Filled 2017-11-27 (×7): qty 1

## 2017-11-27 MED ORDER — MORPHINE SULFATE (PF) 2 MG/ML IV SOLN
INTRAVENOUS | Status: AC
  Filled 2017-11-27: qty 1

## 2017-11-27 MED ORDER — ONDANSETRON HCL 4 MG/2ML IJ SOLN: 4.0000 mg | INTRAMUSCULAR | Status: DC | PRN

## 2017-11-27 MED ORDER — HYDROCODONE-ACETAMINOPHEN 5-325 MG PO TABS: 1.0000 | ORAL_TABLET | ORAL | Status: DC | PRN

## 2017-11-27 MED ORDER — BUPIVACAINE-EPINEPHRINE (PF) 0.5% -1:200000 IJ SOLN
INTRAMUSCULAR | Status: AC
  Filled 2017-11-27: qty 30

## 2017-11-27 MED ORDER — 0.9 % SODIUM CHLORIDE (POUR BTL) OPTIME
TOPICAL | Status: DC | PRN
  Administered 2017-11-27 (×2): 1000 mL

## 2017-11-27 MED ORDER — LIDOCAINE-EPINEPHRINE 1 %-1:100000 IJ SOLN
INTRAMUSCULAR | Status: DC | PRN
  Administered 2017-11-27: 10 mL via INTRADERMAL

## 2017-11-27 MED ORDER — FENTANYL CITRATE (PF) 100 MCG/2ML IJ SOLN: 25.0000 ug | INTRAMUSCULAR | Status: DC | PRN

## 2017-11-27 SURGICAL SUPPLY — 78 items
BASKET BONE COLLECTION (BASKET) IMPLANT
BATTERY IQ STERILE (MISCELLANEOUS) ×1 IMPLANT
BIT DRILL WIRE PASS 1.3MM (BIT) IMPLANT
BNDG CMPR 75X41 PLY HI ABS (GAUZE/BANDAGES/DRESSINGS)
BNDG GAUZE ELAST 4 BULKY (GAUZE/BANDAGES/DRESSINGS) IMPLANT
BNDG STRETCH 4X75 STRL LF (GAUZE/BANDAGES/DRESSINGS) IMPLANT
BTRY SRG DRVR 1.5 IQ (MISCELLANEOUS) ×1
BUR ACORN 6.0 PRECISION (BURR) ×2 IMPLANT
BUR SPIRAL ROUTER 2.3 (BUR) ×1 IMPLANT
CANISTER SUCT 3000ML PPV (MISCELLANEOUS) ×2 IMPLANT
CARTRIDGE OIL MAESTRO DRILL (MISCELLANEOUS) ×1 IMPLANT
CATH ROBINSON RED A/P 12FR (CATHETERS) IMPLANT
CLIP VESOCCLUDE MED 6/CT (CLIP) IMPLANT
DECANTER SPIKE VIAL GLASS SM (MISCELLANEOUS) ×2 IMPLANT
DIFFUSER DRILL AIR PNEUMATIC (MISCELLANEOUS) ×2 IMPLANT
DRAIN PENROSE 1/2X12 LTX STRL (WOUND CARE) IMPLANT
DRAPE NEUROLOGICAL W/INCISE (DRAPES) ×2 IMPLANT
DRAPE WARM FLUID 44X44 (DRAPE) ×2 IMPLANT
DRILL WIRE PASS 1.3MM (BIT)
DRSG OPSITE POSTOP 4X6 (GAUZE/BANDAGES/DRESSINGS) ×2 IMPLANT
DRSG PAD ABDOMINAL 8X10 ST (GAUZE/BANDAGES/DRESSINGS) IMPLANT
DRSG TELFA 3X8 NADH (GAUZE/BANDAGES/DRESSINGS) ×2 IMPLANT
DURAPREP 6ML APPLICATOR 50/CS (WOUND CARE) ×4 IMPLANT
ELECT REM PT RETURN 9FT ADLT (ELECTROSURGICAL) ×2
ELECTRODE REM PT RTRN 9FT ADLT (ELECTROSURGICAL) ×1 IMPLANT
EVACUATOR SILICONE 100CC (DRAIN) IMPLANT
GAUZE SPONGE 4X4 12PLY STRL (GAUZE/BANDAGES/DRESSINGS) IMPLANT
GAUZE SPONGE 4X4 16PLY XRAY LF (GAUZE/BANDAGES/DRESSINGS) IMPLANT
GLOVE BIO SURGEON STRL SZ7 (GLOVE) ×4 IMPLANT
GLOVE BIO SURGEON STRL SZ8 (GLOVE) ×1 IMPLANT
GLOVE BIOGEL PI IND STRL 7.5 (GLOVE) IMPLANT
GLOVE BIOGEL PI IND STRL 8 (GLOVE) ×1 IMPLANT
GLOVE BIOGEL PI IND STRL 8.5 (GLOVE) ×1 IMPLANT
GLOVE BIOGEL PI INDICATOR 7.5 (GLOVE) ×2
GLOVE BIOGEL PI INDICATOR 8 (GLOVE)
GLOVE BIOGEL PI INDICATOR 8.5 (GLOVE) ×1
GLOVE ECLIPSE 8.0 STRL XLNG CF (GLOVE) ×2 IMPLANT
GLOVE EXAM NITRILE LRG STRL (GLOVE) IMPLANT
GLOVE EXAM NITRILE XL STR (GLOVE) IMPLANT
GLOVE EXAM NITRILE XS STR PU (GLOVE) IMPLANT
GOWN STRL REUS W/ TWL LRG LVL3 (GOWN DISPOSABLE) IMPLANT
GOWN STRL REUS W/ TWL XL LVL3 (GOWN DISPOSABLE) IMPLANT
GOWN STRL REUS W/TWL 2XL LVL3 (GOWN DISPOSABLE) IMPLANT
GOWN STRL REUS W/TWL LRG LVL3 (GOWN DISPOSABLE) ×4
GOWN STRL REUS W/TWL XL LVL3 (GOWN DISPOSABLE)
HEMOSTAT SURGICEL 2X14 (HEMOSTASIS) ×2 IMPLANT
KIT BASIN OR (CUSTOM PROCEDURE TRAY) ×2 IMPLANT
KIT TURNOVER KIT B (KITS) ×2 IMPLANT
NDL HYPO 25X1 1.5 SAFETY (NEEDLE) ×1 IMPLANT
NEEDLE HYPO 25X1 1.5 SAFETY (NEEDLE) ×2 IMPLANT
NS IRRIG 1000ML POUR BTL (IV SOLUTION) ×4 IMPLANT
OIL CARTRIDGE MAESTRO DRILL (MISCELLANEOUS) ×2
PACK CRANIOTOMY CUSTOM (CUSTOM PROCEDURE TRAY) ×2 IMPLANT
PAD ARMBOARD 7.5X6 YLW CONV (MISCELLANEOUS) ×2 IMPLANT
PAD DRESSING TELFA 3X8 NADH (GAUZE/BANDAGES/DRESSINGS) IMPLANT
PATTIES SURGICAL .5 X.5 (GAUZE/BANDAGES/DRESSINGS) IMPLANT
PATTIES SURGICAL .5 X3 (DISPOSABLE) IMPLANT
PATTIES SURGICAL 1X1 (DISPOSABLE) IMPLANT
PIN MAYFIELD SKULL DISP (PIN) IMPLANT
PLATE 1.5  2HOLE LNG NEURO (Plate) ×6 IMPLANT
PLATE 1.5 2HOLE LNG NEURO (Plate) IMPLANT
SCREW SELF DRILL HT 1.5/4MM (Screw) ×12 IMPLANT
SPECIMEN JAR SMALL (MISCELLANEOUS) IMPLANT
SPONGE NEURO XRAY DETECT 1X3 (DISPOSABLE) IMPLANT
SPONGE SURGIFOAM ABS GEL 100 (HEMOSTASIS) ×1 IMPLANT
STAPLER SKIN PROX WIDE 3.9 (STAPLE) ×2 IMPLANT
SUT ETHILON 3 0 FSL (SUTURE) ×2 IMPLANT
SUT ETHILON 3 0 PS 1 (SUTURE) IMPLANT
SUT NURALON 4 0 TR CR/8 (SUTURE) ×4 IMPLANT
SUT VIC AB 2-0 CP2 18 (SUTURE) ×4 IMPLANT
SUT VIC AB 3-0 SH 8-18 (SUTURE) IMPLANT
SYR CONTROL 10ML LL (SYRINGE) IMPLANT
TOWEL GREEN STERILE (TOWEL DISPOSABLE) ×2 IMPLANT
TOWEL GREEN STERILE FF (TOWEL DISPOSABLE) ×2 IMPLANT
TRAY FOLEY MTR SLVR 16FR STAT (SET/KITS/TRAYS/PACK) ×1 IMPLANT
TUBE CONNECTING 12X1/4 (SUCTIONS) ×1 IMPLANT
UNDERPAD 30X30 (UNDERPADS AND DIAPERS) IMPLANT
WATER STERILE IRR 1000ML POUR (IV SOLUTION) ×2 IMPLANT

## 2017-11-27 NOTE — Brief Op Note (Signed)
11/27/2017  4:59 PM  PATIENT:  Angel Hatfield  83 y.o. male  PRE-OPERATIVE DIAGNOSIS:  Bilateral subdural hematomas  POST-OPERATIVE DIAGNOSIS:  Bilateral subdural hematomas  PROCEDURE:  Procedure(s) with comments: BILATERAL CRANIOTOMIES FOR SUBDURAL HEMATOMA EVACUATION (Bilateral) - BILATERAL CRANIOTOMIES FOR SUBDURAL HEMATOMA EVACUATION   SURGEON:  Surgeon(s) and Role:    Erline Levine, MD - Primary  PHYSICIAN ASSISTANT:   ASSISTANTS: none   ANESTHESIA:   general  EBL:  30 mL   BLOOD ADMINISTERED:none  DRAINS: (2, #10) Jackson-Pratt drain(s) with closed bulb suction in the subdural space   LOCAL MEDICATIONS USED:  MARCAINE    and LIDOCAINE   SPECIMEN:  No Specimen  DISPOSITION OF SPECIMEN:  N/A  COUNTS:  YES  TOURNIQUET:  * No tourniquets in log *  DICTATION: Patient is 83 year old man who has developed bilateral subdural hematomas. He has had progressively worsening confusion with hemiparesis and CT shows bilateral subdural hematoma with mass effect. It was elected to take patient to surgery for bilateral craniotomies for SDH.   Procedure: Following smooth intubation, patient was placed in brow up position. Head was placed on donut head holder and bi-frontal scalp was shaved and prepped and draped in usual sterile fashion. Area of planned incision was infiltrated with lidocaine. A ilinear incision was made and carried through temporalis fascia and muscle to expose calvarium on each side of his head. Skull flaps was elevated exposing subdural hematoma. Dura was opened and subdural was evacuated. The subdural was larger and under greater pressure on the right. Both subdural cavities were irrigated with saline until significantly clearer. Subdural membranes were opened on the right side. Hemostasis was assured. The brain was considerably more relaxed after hematoma evacuation. Bilateral #10 JP drains were placed and anchored with nylon sutures. Bone flaps was replaced with  plates, the fascia and galea were closed with 2-0 vicryl sutures and the skin was re approximated with staples. Sterile occlusive dressings were placed. Patient was returned to a supine position and transferred to the ICU in stable and satisfactory condition. Counts were correct at the end of the case.   PLAN OF CARE: Admit to inpatient   PATIENT DISPOSITION:  PACU - hemodynamically stable.   Delay start of Pharmacological VTE agent (>24hrs) due to surgical blood loss or risk of bleeding: yes

## 2017-11-27 NOTE — H&P (Addendum)
Reason for Consult:bilateral subdural hematomas Referring Physician: Rahil Passey is an 83 y.o. male.  WUJ:[id number] R Schirtzinger is a 83 y.o. male possible history of asthma, GERD, hypertension who presents for evaluation of multiple falls, generalized weakness and confusion.  Daughter reports that patient had a mechanical fall that occurred about 2 weeks ago.  He reports that he was standing and cooking his breakfast (a ham biscuit) and leaning against his walker when the wheels moved, causing him to fall down backwards.  She is unsure if he hit his head at that time.  States patient had been acting appropriately after that incident.  She reports to other mechanical fall yesterday.  She states one was when he was attempting to get out of chair and his leg caught on the part of the chair, causing him to fall.  She also reports another fall that she did not witness.  She is unsure of any LOC.  She reports that over the last 24 hours, she has noticed a decrease in patient's mental baseline.  She states patient has been more confused than normal.  She states normally patient is very independent and ambulates with the assistance of a walker but reports that since the falls yesterday, he has had generalized weakness and has had to use his walker completely.  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  He denies any blood thinners.  She reports he has been taking Tylenol and ibuprofen for his pain.  She states that his last dose of Tylenol was this morning prior to ED arrival.  He denies any chest pain, abdominal pain, nausea/vomiting, numbness/weakness of his extremities.  His daughter helped with patient's history.  His wife died 2 years ago.      Past Medical History:  Diagnosis Date  . Adenomatous colon polyp   . Arthritis   . Asthma   . Barrett's esophagus 2006  . Esophageal dysmotility   . GERD (gastroesophageal reflux disease)   . Hearing  impairment   . Hiatal hernia   . HTN (hypertension)   . Stricture of esophagus   . Visual impairment     Past Surgical History:  Procedure Laterality Date  . AMPUTATION Left 10/18/2014   Procedure: RING FINGER REVISION AMPUTATION WITH LOCAL NEURECTOMIES;  Surgeon: Iran Planas, MD;  Location: [facility];  Service: Orthopedics;  Laterality: Left;  . COLONOSCOPY  multiple  . INNER EAR SURGERY    . SCROTAL SURGERY    . TONSILLECTOMY AND ADENOIDECTOMY    . TOTAL KNEE ARTHROPLASTY     right  . UPPER GASTROINTESTINAL ENDOSCOPY  multiple    Family History  Problem Relation Age of Onset  . Stroke Mother   . Alzheimer's disease Mother   . Heart failure Father   . Emphysema Father     Social History:  reports that he has never smoked. He has never used smokeless tobacco. He reports that he drinks about 4.2 oz of alcohol per week. He reports that he does not use drugs.  Allergies:  Allergies  Allergen Reactions  . Penicillins     Tongue sweeling    Medications: I have reviewed the patient's current medications.  Results for orders placed or performed during the hospital encounter of 11/27/17 (from the past 48 hour(s))  Basic metabolic panel     Status: Abnormal   Collection Time: 11/27/17 12:34 PM  Result Value Ref Range   Sodium 133 (L) 135 -  145 mmol/L   Potassium 3.1 (L) 3.5 - 5.1 mmol/L   Chloride 101 101 - 111 mmol/L   CO2 21 (L) 22 - 32 mmol/L   Glucose, Bld 144 (H) 65 - 99 mg/dL   BUN 17 6 - 20 mg/dL   Creatinine, Ser 0.92 0.61 - 1.24 mg/dL   Calcium 9.2 8.9 - 10.3 mg/dL   GFR calc non Af Amer >60 >60 mL/min   GFR calc Af Amer >60 >60 mL/min    Comment: (NOTE) The eGFR has been calculated using the CKD EPI equation. This calculation has not been validated in all clinical situations. eGFR's persistently <60 mL/min signify possible Chronic Kidney Disease.    Anion gap 11 5 - 15    Comment: Performed at [facility] [address], [place], [place] [address]  CBC      Status: None   Collection Time: 11/27/17 12:34 PM  Result Value Ref Range   WBC 9.7 4.0 - 10.5 K/uL   RBC 5.00 4.22 - 5.81 MIL/uL   Hemoglobin 13.7 13.0 - 17.0 g/dL   HCT 43.2 39.0 - 52.0 %   MCV 86.4 78.0 - 100.0 fL   MCH 27.4 26.0 - 34.0 pg   MCHC 31.7 30.0 - 36.0 g/dL   RDW 14.2 11.5 - 15.5 %   Platelets 276 150 - 400 K/uL    Comment: Performed at [facility] [address] [place], [place] [address]  CBG monitoring, ED     Status: Abnormal   Collection Time: 11/27/17  1:26 PM  Result Value Ref Range   Glucose-Capillary 150 (H) 65 - 99 mg/dL   Comment 1 Notify RN    Comment 2 Document in Chart   Type and screen All Cardiac and thoracic surgeries, spinal fusions, myomectomies, craniotomies, colon & liver resections, total joint revisions, same day c-section with placenta previa or accreta.     Status: None (Preliminary result)   Collection Time: 11/27/17  2:10 PM  Result Value Ref Range   ABO/RH(D) O POS    Antibody Screen PENDING    Sample Expiration      11/30/2017 Performed at [facility], [place] [address], [place], [place] [address]   ABO/Rh     Status: None (Preliminary result)   Collection Time: 11/27/17  2:10 PM  Result Value Ref Range   ABO/RH(D)      O POS Performed at [facility] [address], [place], [place] [address]     Ct Head Wo Contrast  Result Date: 11/27/2017 CLINICAL DATA:  Focal neuro deficit.  Recent falls EXAM: CT HEAD WITHOUT CONTRAST TECHNIQUE: Contiguous axial images were obtained from the base of the skull through the vertex without intravenous contrast. COMPARISON:  MRI 01/15/2013 FINDINGS: Brain: Large bilateral isodense subdural hematoma right greater than left. Right-sided fluid collection measures up to 17 mm, left-sided collection 12 mm. There is considerable mass-effect on the brain with 7.5 mm midline shift to the left. Mild interhemispheric subdural hematoma. Negative for hydrocephalus.  No acute infarct or mass.  Vascular: Negative for hyperdense vessel Skull: Negative Sinuses/Orbits: Mucosal edema paranasal sinuses.  Negative orbit Other: None IMPRESSION: Large bilateral isodense subdural hematoma right greater than left. 7.5 mm midline shift to the left. Small interhemispheric subdural hematoma These results were called by telephone at the time of interpretation on 11/27/2017 at 1:05 pm to Dr. Ashok Cordia , who verbally acknowledged these results. Electronically Signed   By: Juanda Crumble  Carlis Abbott M.D.   On: 11/27/2017 13:08    Review of Systems - Negative except As above    Blood pressure (!) 117/97, pulse 73, temperature 98.2 F (36.8 C), temperature source Oral, resp. rate 16, height '5\' 8"'$  (1.727 m), weight 78 kg (172 lb), SpO2 96 %. Physical Exam  Constitutional: He appears well-developed and well-nourished.  HENT:  Head: Normocephalic and atraumatic.  Eyes: Pupils are equal, round, and reactive to light. EOM are normal.  Neck: Normal range of motion.  Neurological: He is alert. He is disoriented. No cranial nerve deficit or sensory deficit. GCS eye subscore is 4. GCS verbal subscore is 4. GCS motor subscore is 6.  Patient does not know what year it is.  He does know that Trump is Software engineer and is sure that Mady Gemma is not.  He has a left hemiparesis and pronator drift.    Assessment/Plan: Patient has had multiple falls and has bilateral chronic SDH's with right > left mass effect and shift.  He fell 2 weeks ago and is now getting more confused.  I have recommended bilateral craniotomies for subdural hematoma with placement of drains.  He and his family agree with this recommendation and wish to proceed with surgery.  They understand the potential risks and benefits.    Peggyann Shoals, MD 11/27/2017, 2:55 PM    There is brain edema on head CT scan.

## 2017-11-27 NOTE — ED Provider Notes (Signed)
[facility] EMERGENCY DEPARTMENT Provider Note   CSN: [id number] Arrival date & time: 11/27/17  1208     History   Chief Complaint Chief Complaint  Patient presents with  . Weakness  . Fall    HPI Angel Hatfield is a 83 y.o. male possible history of asthma, GERD, hypertension who presents for evaluation of multiple falls, generalized weakness and confusion.  Daughter reports that patient had a mechanical fall that occurred about 2 weeks ago.  He reports that he was standing and cooking his breakfast and leaning against his walker when the wheels moved, causing him to fall down backwards.  She is unsure if he hit his head at that time.  States patient had been acting appropriately after that incident.  She reports to other mechanical fall yesterday.  She states one was when he was attempting to get out of chair and his leg caught on the part of the chair, causing him to fall.  She also reports another fall that she did not witness.  She is unsure of any LOC.  She reports that over the last 24 hours, she has noticed a decrease in patient's mental baseline.  She states patient has been more confused than normal.  She states normally patient is very independent and ambulates with the assistance of a walker but reports that since the falls yesterday, he has had generalized weakness and has had to use his walker completely.  He denies any blood thinners.  She reports he has been taking Tylenol and ibuprofen for his pain.  She states that his last dose of Tylenol was this morning prior to ED arrival.  He denies any chest pain, abdominal pain, nausea/vomiting, numbness/weakness of his extremities.  EM LEVEL 5 CAVEAT DUE TO MENTAL STATUS CHANGE  The history is provided by a relative.    Past Medical History:  Diagnosis Date  . Adenomatous colon polyp   . Arthritis   . Asthma   . Barrett's esophagus 2006  . Esophageal dysmotility   . GERD (gastroesophageal reflux disease)     . Hearing impairment   . Hiatal hernia   . HTN (hypertension)   . Stricture of esophagus   . Visual impairment     There are no active problems to display for this patient.   Past Surgical History:  Procedure Laterality Date  . AMPUTATION Left 10/18/2014   Procedure: RING FINGER REVISION AMPUTATION WITH LOCAL NEURECTOMIES;  Surgeon: Iran Planas, MD;  Location: [facility];  Service: Orthopedics;  Laterality: Left;  . COLONOSCOPY  multiple  . INNER EAR SURGERY    . SCROTAL SURGERY    . TONSILLECTOMY AND ADENOIDECTOMY    . TOTAL KNEE ARTHROPLASTY     right  . UPPER GASTROINTESTINAL ENDOSCOPY  multiple        Home Medications    Prior to Admission medications   Medication Sig Start Date End Date Taking? Authorizing Provider  cephALEXin (KEFLEX) 500 MG capsule Take 1 capsule (500 mg total) by mouth 4 (four) times daily. 10/17/14   Margarita Mail, PA-C  clindamycin (CLEOCIN) 300 MG capsule Take 1 capsule (300 mg total) by mouth 3 (three) times daily. 10/18/14   Iran Planas, MD  lisinopril-hydrochlorothiazide (PRINZIDE,ZESTORETIC) 10-12.5 MG per tablet Take 0.5 tablets by mouth daily.    [provider]  traMADol (ULTRAM) 50 MG tablet Take 1 tablet (50 mg total) by mouth every 6 (six) hours as needed. 10/17/14   Margarita Mail, PA-C  traZODone (  DESYREL) 100 MG tablet Take 100 mg by mouth at bedtime as needed for sleep.  09/22/11   [provider]    Family History Family History  Problem Relation Age of Onset  . Stroke Mother   . Alzheimer's disease Mother   . Heart failure Father   . Emphysema Father     Social History Social History   Tobacco Use  . Smoking status: Never Smoker  . Smokeless tobacco: Never Used  Substance Use Topics  . Alcohol use: Yes    Alcohol/week: 4.2 oz    Types: 7 Standard drinks or equivalent per week  . Drug use: No     Allergies   Penicillins   Review of Systems Review of Systems  Unable to perform ROS: Mental status  change     Physical Exam Updated Vital Signs BP (!) 117/97   Pulse 73   Temp 98.2 F (36.8 C) (Oral)   Resp 16   Ht 5\' 8"  (1.727 m)   Wt 78 kg (172 lb)   SpO2 96%   BMI 26.15 kg/m   Physical Exam  Constitutional: He is oriented to person, place, and time. He appears well-developed and well-nourished.  HENT:  Head: Normocephalic and atraumatic.  Mouth/Throat: Oropharynx is clear and moist and mucous membranes are normal.  Eyes: Pupils are equal, round, and reactive to light. Conjunctivae, EOM and lids are normal.  Neck: Full passive range of motion without pain.  Full flexion/extension and lateral movement of neck fully intact. No bony midline tenderness. No deformities or crepitus.  Cardiovascular: Normal rate, regular rhythm, normal heart sounds and normal pulses. Exam reveals no gallop and no friction rub.  No murmur heard. Pulmonary/Chest: Effort normal and breath sounds normal.  Lungs clear to auscultation bilaterally.  Symmetric chest rise.  No wheezing, rales, rhonchi.  Abdominal: Soft. Normal appearance. There is no tenderness. There is no rigidity and no guarding.  Musculoskeletal: Normal range of motion.  Neurological: He is alert and oriented to person, place, and time.  Cranial nerves III-XII intact Follows commands, Moves all extremities  5/5 strength to BUE and BLE  Sensation intact throughout all major nerve distributions Normal coordination No pronator drift. No slurred speech. No facial droop.   Skin: Skin is warm and dry. Capillary refill takes less than 2 seconds.  Psychiatric: He has a normal mood and affect. His speech is normal.  Nursing note and vitals reviewed.    ED Treatments / Results  Labs (all labs ordered are listed, but only abnormal results are displayed) Labs Reviewed  BASIC METABOLIC PANEL - Abnormal; Notable for the following components:      Result Value   Sodium 133 (*)    Potassium 3.1 (*)    CO2 21 (*)    Glucose, Bld 144 (*)     All other components within normal limits  CBG MONITORING, ED - Abnormal; Notable for the following components:   Glucose-Capillary 150 (*)    All other components within normal limits  CBC  URINALYSIS, ROUTINE W REFLEX MICROSCOPIC  TYPE AND SCREEN    EKG EKG Interpretation  Date/Time:  Saturday November 27 2017 12:24:44 EDT Ventricular Rate:  71 PR Interval:  174 QRS Duration: 92 QT Interval:  400 QTC Calculation: 434 R Axis:   42 Text Interpretation:  Normal sinus rhythm Junctional ST depression, probably normal Borderline ECG Confirmed by Tanna Furry [id number]), editor Shon Hale [phone]) on 11/27/2017 1:36:47 PM   Radiology Ct Head Wo Contrast  Result Date: 11/27/2017 CLINICAL DATA:  Focal neuro deficit.  Recent falls EXAM: CT HEAD WITHOUT CONTRAST TECHNIQUE: Contiguous axial images were obtained from the base of the skull through the vertex without intravenous contrast. COMPARISON:  MRI 01/15/2013 FINDINGS: Brain: Large bilateral isodense subdural hematoma right greater than left. Right-sided fluid collection measures up to 17 mm, left-sided collection 12 mm. There is considerable mass-effect on the brain with 7.5 mm midline shift to the left. Mild interhemispheric subdural hematoma. Negative for hydrocephalus.  No acute infarct or mass. Vascular: Negative for hyperdense vessel Skull: Negative Sinuses/Orbits: Mucosal edema paranasal sinuses.  Negative orbit Other: None IMPRESSION: Large bilateral isodense subdural hematoma right greater than left. 7.5 mm midline shift to the left. Small interhemispheric subdural hematoma These results were called by telephone at the time of interpretation on 11/27/2017 at 1:05 pm to Dr. Ashok Cordia , who verbally acknowledged these results. Electronically Signed   By: Franchot Gallo M.D.   On: 11/27/2017 13:08    Procedures .Critical Care Performed by: Volanda Napoleon, PA-C Authorized by: Volanda Napoleon, PA-C   Critical care provider statement:      Critical care time (minutes):  35   Critical care was necessary to treat or prevent imminent or life-threatening deterioration of the following conditions:  Cardiac failure, renal failure, CNS failure or compromise and circulatory failure   Critical care was time spent personally by me on the following activities:  Blood draw for specimens, ordering and performing treatments and interventions, ordering and review of laboratory studies, development of treatment plan with patient or surrogate, ordering and review of radiographic studies and re-evaluation of patient's condition   (including critical care time)  Medications Ordered in ED Medications  0.9 %  sodium chloride infusion (has no administration in time range)     Initial Impression / Assessment and Plan / ED Course  I have reviewed the triage vital signs and the nursing notes.  Pertinent labs & imaging results that were available during my care of the patient were reviewed by me and considered in my medical decision making (see chart for details).     83 year old male who presents for evaluation of mechanical falls.  First was 2 weeks ago.  Most recently had a fall yesterday.  Daughter reports that since fall yesterday, patient has been more confused than normal.  The patient is not on any blood thinners.  Has been taking Tylenol and ibuprofen for headache.  Last dose was this morning.  No vomiting. Patient is afebrile, non-toxic appearing, sitting comfortably on examination table. Vital signs reviewed and stable.  Reassuring neuro exam.  Initial labs, imaging ordered at triage.  BMP shows potassium 3.1, bicarb 21.  Glucose is 144.  CBC without significant leukocytosis, anemia.  CT head shows large bilateral subdural hematoma right greater than left with a 7.5 mm midline shift to the left.  Additionally there is a small interhemispheric subdural hematoma.  We will plan to consult neurosurgery.  Discussed with Dr. Vertell Limber  (Neurosurgery). Recommends operative treatment.  He will come see patient in the ED.  Patient with be going to directly to the OR for surgical intervention.    Final Clinical Impressions(s) / ED Diagnoses   Final diagnoses:  Subdural hematoma [facility])    ED Discharge Orders    None       Volanda Napoleon, PA-C 11/27/17 1408    Lajean Saver, MD 11/27/17 1447

## 2017-11-27 NOTE — Anesthesia Procedure Notes (Signed)
Procedure Name: Intubation Date/Time: 11/27/2017 3:12 PM Performed by: Eligha Bridegroom, CRNA Pre-anesthesia Checklist: Emergency Drugs available, Patient identified, Suction available, Patient being monitored and Timeout performed Patient Re-evaluated:Patient Re-evaluated prior to induction Oxygen Delivery Method: Circle system utilized Preoxygenation: Pre-oxygenation with 100% oxygen Induction Type: Rapid sequence and Cricoid Pressure applied Laryngoscope Size: Mac and 4 Grade View: Grade I Tube type: Oral Tube size: 7.5 mm Number of attempts: 1 Airway Equipment and Method: Stylet Placement Confirmation: ETT inserted through vocal cords under direct vision,  positive ETCO2 and breath sounds checked- equal and bilateral Secured at: 21 cm Tube secured with: Tape Dental Injury: Teeth and Oropharynx as per pre-operative assessment

## 2017-11-27 NOTE — Progress Notes (Signed)
Pharmacy Antibiotic Note  Angel Hatfield is a 83 y.o. male admitted on 11/27/2017.  Pharmacy has been consulted for vancomycin dosing for surgical prophylaxis. Pt is afebrile and WBC is WNL. SCr is WNL.   Plan: Vancomycin 750mg  IV Q12H x 24 hours   Height: 5\' 8"  (172.7 cm) Weight: 172 lb (78 kg) IBW/kg (Calculated) : 68.4  Temp (24hrs), Avg:98.3 F (36.8 C), Min:98.1 F (36.7 C), Max:98.6 F (37 C)  Recent Labs  Lab 11/27/17 1234  WBC 9.7  CREATININE 0.92    Estimated Creatinine Clearance: 60.9 mL/min (by C-G formula based on SCr of 0.92 mg/dL).    Allergies  Allergen Reactions  . Penicillins     Tongue sweeling    Thank you for allowing pharmacy to be a part of this patient's care.  Marrion Accomando, Rande Lawman 11/27/2017 6:42 PM

## 2017-11-27 NOTE — Anesthesia Procedure Notes (Signed)
Arterial Line Insertion Start/End6/15/2019 3:45 PM, 11/27/2017 3:53 PM Performed by: Suzette Battiest, MD  Patient location: Pre-op. Preanesthetic checklist: patient identified, IV checked, site marked, risks and benefits discussed, surgical consent, monitors and equipment checked, pre-op evaluation, timeout performed and anesthesia consent Lidocaine 1% used for infiltration Right, brachial was placed Catheter size: 20 Fr Hand hygiene performed  and maximum sterile barriers used   Attempts: 2 Procedure performed using ultrasound guided technique. Ultrasound Notes:image(s) printed for medical record Following insertion, dressing applied and Biopatch. Post procedure assessment: normal and unchanged  Patient tolerated the procedure well with no immediate complications.

## 2017-11-27 NOTE — Op Note (Signed)
11/27/2017  4:59 PM  PATIENT:  Angel Hatfield  83 y.o. male  PRE-OPERATIVE DIAGNOSIS:  Bilateral subdural hematomas  POST-OPERATIVE DIAGNOSIS:  Bilateral subdural hematomas  PROCEDURE:  Procedure(s) with comments: BILATERAL CRANIOTOMIES FOR SUBDURAL HEMATOMA EVACUATION (Bilateral) - BILATERAL CRANIOTOMIES FOR SUBDURAL HEMATOMA EVACUATION   SURGEON:  Surgeon(s) and Role:    Erline Levine, MD - Primary  PHYSICIAN ASSISTANT:   ASSISTANTS: none   ANESTHESIA:   general  EBL:  30 mL   BLOOD ADMINISTERED:none  DRAINS: (2, #10) Jackson-Pratt drain(s) with closed bulb suction in the subdural space   LOCAL MEDICATIONS USED:  MARCAINE    and LIDOCAINE   SPECIMEN:  No Specimen  DISPOSITION OF SPECIMEN:  N/A  COUNTS:  YES  TOURNIQUET:  * No tourniquets in log *  DICTATION: Patient is 83 year old man who has developed bilateral subdural hematomas. He has had progressively worsening confusion with hemiparesis and CT shows bilateral subdural hematoma with mass effect. It was elected to take patient to surgery for bilateral craniotomies for SDH.   Procedure: Following smooth intubation, patient was placed in brow up position. Head was placed on donut head holder and bi-frontal scalp was shaved and prepped and draped in usual sterile fashion. Area of planned incision was infiltrated with lidocaine. A ilinear incision was made and carried through temporalis fascia and muscle to expose calvarium on each side of his head. Skull flaps was elevated exposing subdural hematoma. Dura was opened and subdural was evacuated. The subdural was larger and under greater pressure on the right. Both subdural cavities were irrigated with saline until significantly clearer. Subdural membranes were opened on the right side. Hemostasis was assured. The brain was considerably more relaxed after hematoma evacuation. Bilateral #10 JP drains were placed and anchored with nylon sutures. Bone flaps was replaced with  plates, the fascia and galea were closed with 2-0 vicryl sutures and the skin was re approximated with staples. Sterile occlusive dressings were placed. Patient was returned to a supine position and transferred to the ICU in stable and satisfactory condition. Counts were correct at the end of the case.   PLAN OF CARE: Admit to inpatient   PATIENT DISPOSITION:  PACU - hemodynamically stable.   Delay start of Pharmacological VTE agent (>24hrs) due to surgical blood loss or risk of bleeding: yes

## 2017-11-27 NOTE — Progress Notes (Signed)
Pt admitted to room 4 No 24. He is non verbal except for moans, follows commands, MAE. He opens eyes to repeated stimulation. He is flopping around in bed (writhing?) so morphine given. Pupils equal and reactive. Per report this is his post op baseline and Dr Vertell Limber is aware. VSS.

## 2017-11-27 NOTE — Anesthesia Postprocedure Evaluation (Signed)
Anesthesia Post Note  Patient: Angel Hatfield  Procedure(s) Performed: BILATERAL CRANIOTOMIES FOR SUBDURAL HEMATOMA EVACUATION (Bilateral Head)     Patient location during evaluation: PACU Anesthesia Type: General Level of consciousness: awake and alert Pain management: pain level controlled Vital Signs Assessment: post-procedure vital signs reviewed and stable Respiratory status: spontaneous breathing, nonlabored ventilation, respiratory function stable and patient connected to nasal cannula oxygen Cardiovascular status: blood pressure returned to baseline and stable Postop Assessment: no apparent nausea or vomiting Anesthetic complications: no    Last Vitals:  Vitals:   11/27/17 1759 11/27/17 1802  BP: 133/76   Pulse: (!) 105 75  Resp: 18 18  Temp:  36.7 C  SpO2: 95% 96%    Last Pain:  Vitals:   11/27/17 1220  TempSrc:   PainSc: 2                  Tiajuana Amass

## 2017-11-27 NOTE — Progress Notes (Signed)
Family has belongings  

## 2017-11-27 NOTE — Anesthesia Preprocedure Evaluation (Addendum)
Anesthesia Evaluation  Patient identified by MRN, date of birth, ID band Patient awake    Reviewed: Allergy & Precautions, NPO status , Patient's Chart, lab work & pertinent test resultsPreop documentation limited or incomplete due to emergent nature of procedure.  Airway Mallampati: II  TM Distance: >3 FB Neck ROM: Full    Dental  (+) Dental Advisory Given   Pulmonary asthma ,    breath sounds clear to auscultation       Cardiovascular hypertension, Pt. on medications  Rhythm:Regular Rate:Normal     Neuro/Psych Bilateral subdural hematomas    GI/Hepatic Neg liver ROS, hiatal hernia, GERD  ,  Endo/Other  negative endocrine ROS  Renal/GU negative Renal ROS     Musculoskeletal  (+) Arthritis ,   Abdominal   Peds  Hematology negative hematology ROS (+)   Anesthesia Other Findings   Reproductive/Obstetrics                             Lab Results  Component Value Date   WBC 9.7 11/27/2017   HGB 13.7 11/27/2017   HCT 43.2 11/27/2017   MCV 86.4 11/27/2017   PLT 276 11/27/2017   Lab Results  Component Value Date   CREATININE 0.92 11/27/2017   BUN 17 11/27/2017   NA 133 (L) 11/27/2017   K 3.1 (L) 11/27/2017   CL 101 11/27/2017   CO2 21 (L) 11/27/2017    Anesthesia Physical Anesthesia Plan  ASA: IV and emergent  Anesthesia Plan: General   Post-op Pain Management:    Induction: Intravenous  PONV Risk Score and Plan: 2 and Ondansetron, Dexamethasone and Treatment may vary due to age or medical condition  Airway Management Planned: Oral ETT  Additional Equipment: Arterial line  Intra-op Plan:   Post-operative Plan: Possible Post-op intubation/ventilation  Informed Consent: I have reviewed the patients History and Physical, chart, labs and discussed the procedure including the risks, benefits and alternatives for the proposed anesthesia with the patient or authorized  representative who has indicated his/her understanding and acceptance.   Dental advisory given  Plan Discussed with: CRNA  Anesthesia Plan Comments:       Anesthesia Quick Evaluation

## 2017-11-27 NOTE — Addendum Note (Signed)
Addendum  created 11/27/17 1838 by Eligha Bridegroom, CRNA   Intraprocedure Meds edited

## 2017-11-27 NOTE — ED Triage Notes (Signed)
Patient to ED c/o generalized weakness that has progressed over the last couple weeks - has had multiple falls, first one about 2 weeks ago in which he hit his head. Denies LOC with any, denies dizziness - states that he just feels weaker. Normally more active and ambulates with cane and walker, but has been limited to using walker. Weakness bilateral, no drift, no facial droop, no speech changes. Patient c/o intermittent headaches. No other injuries. Not on blood thinner.

## 2017-11-27 NOTE — Transfer of Care (Signed)
Immediate Anesthesia Transfer of Care Note  Patient: Angel Hatfield  Procedure(s) Performed: BILATERAL CRANIOTOMIES FOR SUBDURAL HEMATOMA EVACUATION (Bilateral Head)  Patient Location: PACU  Anesthesia Type:General  Level of Consciousness: lethargic  Airway & Oxygen Therapy: Patient Spontanous Breathing and Patient connected to nasal cannula oxygen  Post-op Assessment: Report given to RN and Post -op Vital signs reviewed and stable  Post vital signs: Reviewed and stable  Last Vitals:  Vitals Value Taken Time  BP 127/81 11/27/2017  5:14 PM  Temp    Pulse 83 11/27/2017  5:18 PM  Resp 14 11/27/2017  5:18 PM  SpO2 95 % 11/27/2017  5:18 PM  Vitals shown include unvalidated device data.  Last Pain:  Vitals:   11/27/17 1220  TempSrc:   PainSc: 2          Complications: No apparent anesthesia complications

## 2017-11-28 ENCOUNTER — Encounter (HOSPITAL_COMMUNITY): Payer: Self-pay

## 2017-11-28 ENCOUNTER — Inpatient Hospital Stay (HOSPITAL_COMMUNITY): Payer: Medicare Other

## 2017-11-28 NOTE — Progress Notes (Signed)
Subjective: Patient reports feeling better  Objective: Vital signs in last 24 hours: Temp:  [97.8 F (36.6 C)-99.1 F (37.3 C)] 99.1 F (37.3 C) (06/16 0800) Pulse Rate:  [50-105] 51 (06/16 0700) Resp:  [14-28] 18 (06/16 0700) BP: (105-147)/(58-104) 123/78 (06/16 0700) SpO2:  [91 %-99 %] 93 % (06/16 0700) Arterial Line BP: (111-156)/(65-96) 129/70 (06/16 0700) Weight:  [78 kg (172 lb)] 78 kg (172 lb) (06/15 1216)  Intake/Output from previous day: 06/15 0701 - 06/16 0700 In: 1283.3 [I.V.:712.5; IV Piggyback:570.8] Out: 2812 [Urine:2300; Drains:482; Blood:30] Intake/Output this shift: No intake/output data recorded.  Physical Exam: Awake, alert, conversant.  Headache is improved.  No pronator drift.  MAEW with good power.  Dressings CDI.  Lab Results: Recent Labs    11/27/17 1234  WBC 9.7  HGB 13.7  HCT 43.2  PLT 276   BMET Recent Labs    11/27/17 1234  NA 133*  K 3.1*  CL 101  CO2 21*  GLUCOSE 144*  BUN 17  CREATININE 0.92  CALCIUM 9.2    Studies/Results: Ct Head Wo Contrast  Result Date: 11/28/2017 CLINICAL DATA:  Follow up intracranial hemorrhage. EXAM: CT HEAD WITHOUT CONTRAST TECHNIQUE: Contiguous axial images were obtained from the base of the skull through the vertex without intravenous contrast. COMPARISON:  CT HEAD November 27, 2017 FINDINGS: BRAIN: Interval evacuation of bilateral subdural hematomas with residual 16 mm RIGHT and 5 mm LEFT heterogeneous subdural collections. Bilateral extra-axial surgical drains in place with extra-axial pneumocephalus. RIGHT parafalcine to cerebellar tentorium dense subdural hematoma measures defect 5 mm. Stable 7 mm RIGHT to LEFT subfalcine herniation. Partially re-expanded lateral ventricles. No hydrocephalus. No intraparenchymal hemorrhage or acute large vascular territory infarcts. Basal cisterns are patent. VASCULAR: Mild calcific atherosclerosis of the carotid siphons. SKULL: No skull fracture. New bifrontal craniotomies  and verbal with scalp soft tissue swelling, subcutaneous gas and skin staples. No significant scalp soft tissue swelling. SINUSES/ORBITS: Acute on chronic paranasal sinusitis. Mastoid air cells are well aerated.The included ocular globes and orbital contents are non-suspicious. OTHER: None. IMPRESSION: 1. Interval bifrontal craniotomy for subdural evacuation, residual 16 mm RIGHT and 5 mm LEFT subdural hematomas. Extra-axial surgical drains in place. 2. Similar 7 mm RIGHT-to-LEFT midline shift. Partially re-expanded lateral ventricles. 3. 5 mm falcotentorial acute subdural hematoma. Electronically Signed   By: Elon Alas M.D.   On: 11/28/2017 04:48   Ct Head Wo Contrast  Result Date: 11/27/2017 CLINICAL DATA:  Focal neuro deficit.  Recent falls EXAM: CT HEAD WITHOUT CONTRAST TECHNIQUE: Contiguous axial images were obtained from the base of the skull through the vertex without intravenous contrast. COMPARISON:  MRI 01/15/2013 FINDINGS: Brain: Large bilateral isodense subdural hematoma right greater than left. Right-sided fluid collection measures up to 17 mm, left-sided collection 12 mm. There is considerable mass-effect on the brain with 7.5 mm midline shift to the left. Mild interhemispheric subdural hematoma. Negative for hydrocephalus.  No acute infarct or mass. Vascular: Negative for hyperdense vessel Skull: Negative Sinuses/Orbits: Mucosal edema paranasal sinuses.  Negative orbit Other: None IMPRESSION: Large bilateral isodense subdural hematoma right greater than left. 7.5 mm midline shift to the left. Small interhemispheric subdural hematoma These results were called by telephone at the time of interpretation on 11/27/2017 at 1:05 pm to Dr. Ashok Cordia , who verbally acknowledged these results. Electronically Signed   By: Franchot Gallo M.D.   On: 11/27/2017 13:08    Assessment/Plan: Patient is doing well.  Continue drains.  May be up to chair.  Advance  diet.  D/C Foley and A-line.    LOS: 1 day     Peggyann Shoals, MD 11/28/2017, 8:45 AM

## 2017-11-29 ENCOUNTER — Encounter (HOSPITAL_COMMUNITY): Payer: Self-pay | Admitting: Neurosurgery

## 2017-11-29 MED ORDER — TAMSULOSIN HCL 0.4 MG PO CAPS
0.4000 mg | ORAL_CAPSULE | Freq: Every day | ORAL | Status: DC
  Administered 2017-11-29 – 2017-12-03 (×3): 0.4 mg via ORAL
  Filled 2017-11-29 (×4): qty 1

## 2017-11-29 NOTE — Addendum Note (Signed)
Addendum  created 11/29/17 0915 by Eligha Bridegroom, CRNA   Intraprocedure Event edited

## 2017-11-29 NOTE — Progress Notes (Signed)
Subjective: Patient reports feeling good  Objective: Vital signs in last 24 hours: Temp:  [97.6 F (36.4 C)-98.6 F (37 C)] 98.5 F (36.9 C) (06/17 0400) Pulse Rate:  [44-73] 49 (06/17 0800) Resp:  [12-22] 15 (06/17 0800) BP: (99-144)/(57-98) 133/62 (06/17 0800) SpO2:  [89 %-98 %] 95 % (06/17 0800) Arterial Line BP: (137-140)/(70-72) 140/72 (06/16 0900)  Intake/Output from previous day: 06/16 0701 - 06/17 0700 In: 4030 [P.O.:720; I.V.:1800; IV Piggyback:1160] Out: 0938 [Urine:3105; Drains:154] Intake/Output this shift: No intake/output data recorded.  Physical Exam: Awake, alert, conversant.  No headache.  No drift.  MAEW well with good power.  Drains with bloody drainage.  Lab Results: Recent Labs    11/27/17 1234  WBC 9.7  HGB 13.7  HCT 43.2  PLT 276   BMET Recent Labs    11/27/17 1234  NA 133*  K 3.1*  CL 101  CO2 21*  GLUCOSE 144*  BUN 17  CREATININE 0.92  CALCIUM 9.2    Studies/Results: Ct Head Wo Contrast  Result Date: 11/28/2017 CLINICAL DATA:  Follow up intracranial hemorrhage. EXAM: CT HEAD WITHOUT CONTRAST TECHNIQUE: Contiguous axial images were obtained from the base of the skull through the vertex without intravenous contrast. COMPARISON:  CT HEAD November 27, 2017 FINDINGS: BRAIN: Interval evacuation of bilateral subdural hematomas with residual 16 mm RIGHT and 5 mm LEFT heterogeneous subdural collections. Bilateral extra-axial surgical drains in place with extra-axial pneumocephalus. RIGHT parafalcine to cerebellar tentorium dense subdural hematoma measures defect 5 mm. Stable 7 mm RIGHT to LEFT subfalcine herniation. Partially re-expanded lateral ventricles. No hydrocephalus. No intraparenchymal hemorrhage or acute large vascular territory infarcts. Basal cisterns are patent. VASCULAR: Mild calcific atherosclerosis of the carotid siphons. SKULL: No skull fracture. New bifrontal craniotomies and verbal with scalp soft tissue swelling, subcutaneous gas and  skin staples. No significant scalp soft tissue swelling. SINUSES/ORBITS: Acute on chronic paranasal sinusitis. Mastoid air cells are well aerated.The included ocular globes and orbital contents are non-suspicious. OTHER: None. IMPRESSION: 1. Interval bifrontal craniotomy for subdural evacuation, residual 16 mm RIGHT and 5 mm LEFT subdural hematomas. Extra-axial surgical drains in place. 2. Similar 7 mm RIGHT-to-LEFT midline shift. Partially re-expanded lateral ventricles. 3. 5 mm falcotentorial acute subdural hematoma. Electronically Signed   By: Elon Alas M.D.   On: 11/28/2017 04:48   Ct Head Wo Contrast  Result Date: 11/27/2017 CLINICAL DATA:  Focal neuro deficit.  Recent falls EXAM: CT HEAD WITHOUT CONTRAST TECHNIQUE: Contiguous axial images were obtained from the base of the skull through the vertex without intravenous contrast. COMPARISON:  MRI 01/15/2013 FINDINGS: Brain: Large bilateral isodense subdural hematoma right greater than left. Right-sided fluid collection measures up to 17 mm, left-sided collection 12 mm. There is considerable mass-effect on the brain with 7.5 mm midline shift to the left. Mild interhemispheric subdural hematoma. Negative for hydrocephalus.  No acute infarct or mass. Vascular: Negative for hyperdense vessel Skull: Negative Sinuses/Orbits: Mucosal edema paranasal sinuses.  Negative orbit Other: None IMPRESSION: Large bilateral isodense subdural hematoma right greater than left. 7.5 mm midline shift to the left. Small interhemispheric subdural hematoma These results were called by telephone at the time of interpretation on 11/27/2017 at 1:05 pm to Dr. Ashok Cordia , who verbally acknowledged these results. Electronically Signed   By: Franchot Gallo M.D.   On: 11/27/2017 13:08    Assessment/Plan: Patient is doing well.  Mobilize today.  Was up to chair for 4 hours yesterday.  Continue drains for present.    LOS: 2  days    Peggyann Shoals, MD 11/29/2017, 8:31 AM

## 2017-11-30 NOTE — Progress Notes (Addendum)
Subjective: Patient reports "When can I go home?" he asks, smiling.  Objective: Vital signs in last 24 hours: Temp:  [98 F (36.7 C)-99.9 F (37.7 C)] 98 F (36.7 C) (06/18 0800) Pulse Rate:  [49-104] 104 (06/18 1200) Resp:  [11-20] 15 (06/18 1200) BP: (108-149)/(62-98) 114/76 (06/18 1200) SpO2:  [86 %-100 %] 96 % (06/18 1200)  Intake/Output from previous day: 06/17 0701 - 06/18 0700 In: 2151.7 [P.O.:45; I.V.:1800; IV Piggyback:306.7] Out: 2690 [Urine:2600; Drains:90] Intake/Output this shift: Total I/O In: 441.7 [I.V.:375; IV Piggyback:66.7] Out: -   Alert, conversant, MAEW. Follows commands briskly. No drift. PEARL. JP right 49ml overnight, Left JP 74ml overnight. Drsgs intact, dry.  Lab Results: No results for input(s): WBC, HGB, HCT, PLT in the last 72 hours. BMET No results for input(s): NA, K, CL, CO2, GLUCOSE, BUN, CREATININE, CALCIUM in the last 72 hours.  Studies/Results: No results found.  Assessment/Plan: Improving  LOS: 3 days  Continue support, mobilizing as tolerated.   Verdis Prime 11/30/2017, 1:39 PM   Patient is doing well.  Drains in place.  We will leave them in today, possibly remove them in AM.

## 2017-12-01 ENCOUNTER — Inpatient Hospital Stay (HOSPITAL_COMMUNITY): Payer: Medicare Other

## 2017-12-01 NOTE — Progress Notes (Signed)
Repeat head CT unchanged with exception of left side SDH. JP drains with minimal outpt.  Patient neuro intact. Drains removed today. Incisions where drains were placed were closed with 2 staples on each side.  Patient tolerated well.  No complications.

## 2017-12-01 NOTE — Progress Notes (Signed)
  NEUROSURGERY PROGRESS NOTE   No issues overnight. No real complaints this am.  EXAM:  BP 134/75   Pulse 63   Temp 97.8 F (36.6 C) (Axillary)   Resp 20   Ht 5\' 8"  (1.727 m)   Wt 78 kg (172 lb)   SpO2 100%   BMI 26.15 kg/m   Awake, alert, oriented  Speech fluent CN grossly intact  MAE well Bilateral drains in place, scant output overnight  IMPRESSION:  83 y.o. male POD# 4 s/p  Bilateral crani for SDH evac, doing well  PLAN: - will d/c drains today - Mobilize with PT/OT - Likely transfer to floor tomorrow

## 2017-12-02 MED ORDER — FAMOTIDINE 20 MG PO TABS
20.0000 mg | ORAL_TABLET | Freq: Two times a day (BID) | ORAL | Status: DC
  Administered 2017-12-02 – 2017-12-03 (×2): 20 mg via ORAL
  Filled 2017-12-02 (×2): qty 1

## 2017-12-02 MED ORDER — LEVETIRACETAM 500 MG PO TABS
500.0000 mg | ORAL_TABLET | Freq: Two times a day (BID) | ORAL | Status: DC
  Administered 2017-12-02 – 2017-12-03 (×2): 500 mg via ORAL
  Filled 2017-12-02 (×2): qty 1

## 2017-12-02 NOTE — Evaluation (Signed)
Occupational Therapy Evaluation Patient Details Name: Angel Hatfield MRN: [id number] DOB: 10-Oct-1934 Today's Date: 12/02/2017    History of Present Illness Pt is an 83 y.o. male presenting after multiple falls at home, generalized weakness, and confusion. CT on 6/15 + large bilateral subdural hematoma with 7.28mm midline shift. Pt now s/p bil craniotomies for subdural hematoma evacuation on 6/15. PMHx: Arthritis, Asthma, Barrett's esophagus, GERD, Hearing impairment, HTN, Visual impairment, L ring finger amputation, R TKA.   Clinical Impression   Pt reports he was independent with ADL PTA. Currently pt requires min assist for standing ADL and close min guard for functional mobility due to balance deficits and decreased safety awareness. Pt planning to d/c home with 24/7 supervision from family initially. Recommending HHOT for follow up to maximize independence and safety with ADL and functional mobility upon return home. Pt would benefit from continued skilled OT to address established goals.    Follow Up Recommendations  Home health OT;Supervision/Assistance - 24 hour    Equipment Recommendations  None recommended by OT    Recommendations for Other Services       Precautions / Restrictions Precautions Precautions: Fall Restrictions Weight Bearing Restrictions: No      Mobility Bed Mobility               General bed mobility comments: Pt OOB upon arrival  Transfers Overall transfer level: Needs assistance Equipment used: Rolling walker (2 wheeled) Transfers: Sit to/from Stand Sit to Stand: Min guard         General transfer comment: Min guard for safety    Balance Overall balance assessment: Needs assistance Sitting-balance support: Feet supported;No upper extremity supported Sitting balance-Leahy Scale: Good     Standing balance support: No upper extremity supported;During functional activity Standing balance-Leahy Scale: Poor Standing balance comment:  Posterior LOB x1 standing at the sink during grooming task                           ADL either performed or assessed with clinical judgement   ADL Overall ADL's : Needs assistance/impaired Eating/Feeding: Set up;Sitting   Grooming: Minimal assistance;Standing;Wash/dry hands;Cueing for safety;Cueing for sequencing Grooming Details (indicate cue type and reason): Cues throughout for sequencing hand washing. Min assist for standing balance with posterior LOB Upper Body Bathing: Set up;Supervision/ safety;Sitting   Lower Body Bathing: Minimal assistance;Sit to/from stand   Upper Body Dressing : Set up;Supervision/safety;Sitting   Lower Body Dressing: Minimal assistance;Sit to/from stand   Toilet Transfer: Min guard;Ambulation;Comfort height toilet;RW   Toileting- Clothing Manipulation and Hygiene: Min guard;Sitting/lateral lean       Functional mobility during ADLs: Min guard;Rolling walker       Vision Baseline Vision/History: Wears glasses Wears Glasses: At all times Patient Visual Report: No change from baseline Vision Assessment?: No apparent visual deficits     Perception     Praxis      Pertinent Vitals/Pain Pain Assessment: 0-10 Pain Score: 1  Faces Pain Scale: Hurts a little bit Pain Location: head Pain Descriptors / Indicators: Headache Pain Intervention(s): Monitored during session;Limited activity within patient's tolerance;Repositioned     Hand Dominance Right   Extremity/Trunk Assessment Upper Extremity Assessment Upper Extremity Assessment: RUE deficits/detail RUE Deficits / Details: Reports numbness in thumb and 2nd digit; present PTA RUE Sensation: decreased light touch   Lower Extremity Assessment Lower Extremity Assessment: Defer to PT evaluation       Communication Communication Communication: [facility] [address] [facility]   Cognition Arousal/Alertness:  Awake/alert Behavior During Therapy: Impulsive Overall Cognitive Status: No family/caregiver  present to determine baseline cognitive functioning Area of Impairment: Safety/judgement;Attention;Awareness;Problem solving                   Current Attention Level: Sustained     Safety/Judgement: Decreased awareness of safety;Decreased awareness of deficits Awareness: Emergent Problem Solving: Requires verbal cues(for safety) General Comments: Pt with poor awareness of deficits and poor safety awareness. Unable to identify why head hurts. Unsure of cognitive baseline as pt reports he sat on frame of standard RW while cooking bacon and fell.   General Comments       Exercises     Shoulder Instructions      Home Living Family/patient expects to be discharged to:: Private residence Living Arrangements: Children Available Help at Discharge: Family Type of Home: House Home Access: Stairs to enter Technical brewer of Steps: 3 Entrance Stairs-Rails: Right Home Layout: One level     Bathroom Shower/Tub: Occupational psychologist: Handicapped height Bathroom Accessibility: Yes   Home Equipment: Environmental consultant - 2 wheels;Cane - single point;Shower seat - built in;Bedside commode;Grab bars - tub/shower;Grab bars - toilet;Hand held shower head;Wheelchair - manual          Prior Functioning/Environment Level of Independence: Independent with assistive device(s)        Comments: Cane outside, RW for household mobility. Reports he is independent with ADL.        OT Problem List: Impaired balance (sitting and/or standing);Decreased cognition;Decreased safety awareness;Decreased knowledge of use of DME or AE;Impaired sensation;Pain      OT Treatment/Interventions: Self-care/ADL training;Energy conservation;DME and/or AE instruction;Therapeutic activities;Cognitive remediation/compensation;Patient/family education;Balance training    OT Goals(Current goals can be found in the care plan section) Acute Rehab OT Goals Patient Stated Goal: return home OT Goal  Formulation: With patient Time For Goal Achievement: 12/16/17 Potential to Achieve Goals: Good ADL Goals Pt Will Perform Grooming: with supervision;standing Pt Will Perform Upper Body Dressing: with supervision;sitting Pt Will Perform Lower Body Dressing: with supervision;sit to/from stand Pt Will Perform Tub/Shower Transfer: Shower transfer;with supervision;ambulating;shower seat;rolling walker  OT Frequency: Min 2X/week   Barriers to D/C:            Co-evaluation PT/OT/SLP Co-Evaluation/Treatment: Yes Reason for Co-Treatment: For patient/therapist safety;Necessary to address cognition/behavior during functional activity   OT goals addressed during session: ADL's and self-care      AM-PAC PT "6 Clicks" Daily Activity     Outcome Measure Help from another person eating meals?: A Little Help from another person taking care of personal grooming?: A Little Help from another person toileting, which includes using toliet, bedpan, or urinal?: A Little Help from another person bathing (including washing, rinsing, drying)?: A Little Help from another person to put on and taking off regular upper body clothing?: A Little Help from another person to put on and taking off regular lower body clothing?: A Little 6 Click Score: 18   End of Session Equipment Utilized During Treatment: Gait belt;Rolling walker Nurse Communication: Mobility status  Activity Tolerance: Patient tolerated treatment well Patient left: in chair;with call bell/phone within reach;with chair alarm set  OT Visit Diagnosis: Unsteadiness on feet (R26.81);Repeated falls (R29.6);Pain Pain - part of body: (head)                Time: 8250-0370 OT Time Calculation (min): 28 min Charges:  OT General Charges $OT Visit: 1 Visit OT Evaluation $OT Eval Moderate Complexity: 1 Mod G-Codes:  Cardarius Senat A. Ulice Brilliant, M.S., OTR/L Acute Rehab Department: [phone]  Binnie Kand 12/02/2017, 10:39 AM

## 2017-12-02 NOTE — Progress Notes (Signed)
Neurosurgery Progress Note  No issues overnight.  Feels well this am Minimal headache.  No concerns Eager to go home  EXAM:  BP 128/63   Pulse (!) 44   Temp 98 F (36.7 C) (Oral)   Resp 18   Ht 5\' 8"  (1.727 m)   Wt 78 kg (172 lb)   SpO2 99%   BMI 26.15 kg/m   Awake, alert, oriented  Speech fluent, appropriate  CN grossly intact  5/5 BUE/BLE  Incisions: c/d/i  IMPRESSION/PLAN 83 y.o. male POD# 5 s/p  Bilateral crani for SDH evac, doing well - Will transfer to the floor - Work with therapy today - Hopeful for d/c tomorrow pending therapy eval

## 2017-12-02 NOTE — Evaluation (Addendum)
Physical Therapy Evaluation Patient Details Name: Angel Hatfield MRN: [id number] DOB: 1934-08-15 Today's Date: 12/02/2017   History of Present Illness  Pt is an 83 y.o. male presenting after multiple falls at home, generalized weakness, and confusion. CT on 6/15 + large bilateral subdural hematoma with 7.39mm midline shift. Pt now s/p bil craniotomies for subdural hematoma evacuation on 6/15. PMHx: Arthritis, Asthma, Barrett's esophagus, GERD, Hearing impairment, HTN, Visual impairment, L ring finger amputation, R TKA.  Clinical Impression  Pt mildly unsteady on his feet with decreased safety awareness.  I spoke with one of his daughter's on the phone and he, at baseline is a bit difficult to handle before this event.  They felt his cognition was improving although he is a bit "fuzzy" on the details between his initial fall and now per his daughter's report.  They are agreeable that home therapy follow up would be good and have worked with [facility] in the past and enjoyed them.   PT to follow acutely for deficits listed below.       Follow Up Recommendations Home health PT;Supervision/Assistance - 24 hour(24 hour supervision for first few days, had had [facility] before)    Equipment Recommendations  None recommended by PT    Recommendations for Other Services    NA    Precautions / Restrictions Precautions Precautions: Fall Restrictions Weight Bearing Restrictions: No      Mobility  Bed Mobility               General bed mobility comments: Pt OOB upon arrival  Transfers Overall transfer level: Needs assistance Equipment used: Rolling walker (2 wheeled) Transfers: Sit to/from Stand Sit to Stand: Min guard         General transfer comment: min guard assist for safety verbal cues for safe hand placement.   Ambulation/Gait Ambulation/Gait assistance: Min guard;Min assist Gait Distance (Feet): 130 Feet Assistive device: Rolling walker (2 wheeled) Gait Pattern/deviations:  Step-through pattern;Leaning posteriorly Gait velocity: fast, too fast to be safe right now.    General Gait Details: Pt with one posterior stagger in the bathroom on uneven floor requirng min assist to regain balance.  Otherwise min guard assist for safety and balance in the hallway.          Balance Overall balance assessment: Needs assistance Sitting-balance support: Feet supported Sitting balance-Leahy Scale: Good     Standing balance support: No upper extremity supported Standing balance-Leahy Scale: Poor Standing balance comment: Posterior LOB x1 standing at the sink during grooming task                             Pertinent Vitals/Pain Pain Assessment: 0-10 Pain Score: 1  Faces Pain Scale: Hurts a little bit Pain Location: head Pain Descriptors / Indicators: Headache Pain Intervention(s): Monitored during session;Limited activity within patient's tolerance;Repositioned    Home Living Family/patient expects to be discharged to:: Private residence Living Arrangements: Children Available Help at Discharge: Family Type of Home: House Home Access: Stairs to enter Entrance Stairs-Rails: Right Entrance Stairs-Number of Steps: 3 Home Layout: One level Home Equipment: Environmental consultant - 2 wheels;Cane - single point;Shower seat - built in;Bedside commode;Grab bars - tub/shower;Grab bars - toilet;Hand held shower head;Wheelchair - manual      Prior Function Level of Independence: Independent with assistive device(s)         Comments: Cane outside, RW for household mobility. Reports he is independent with ADL.     Hand  Dominance   Dominant Hand: Right    Extremity/Trunk Assessment   Upper Extremity Assessment Upper Extremity Assessment: Defer to OT evaluation RUE Deficits / Details: Reports numbness in thumb and 2nd digit; present PTA RUE Sensation: decreased light touch    Lower Extremity Assessment Lower Extremity Assessment: Generalized weakness(pt tested  4/5)       Communication   Communication: [facility]  Cognition Arousal/Alertness: Awake/alert Behavior During Therapy: Impulsive Overall Cognitive Status: No family/caregiver present to determine baseline cognitive functioning Area of Impairment: Safety/judgement;Attention;Awareness;Problem solving                   Current Attention Level: Sustained     Safety/Judgement: Decreased awareness of safety;Decreased awareness of deficits Awareness: Emergent Problem Solving: Requires verbal cues(for safety) General Comments: Pt impulsive with poor safety awareness, unable to clearly state why he is here and that he has had surgery and relate that to why he has a HA.               Assessment/Plan    PT Assessment Patient needs continued PT services  PT Problem List Decreased strength;Decreased activity tolerance;Decreased balance;Decreased mobility;Decreased cognition;Decreased knowledge of use of DME;Decreased safety awareness;Decreased knowledge of precautions;Pain       PT Treatment Interventions DME instruction;Gait training;Stair training;Functional mobility training;Therapeutic activities;Therapeutic exercise;Balance training;Cognitive remediation;Neuromuscular re-education;Patient/family education    PT Goals (Current goals can be found in the Care Plan section)  Acute Rehab PT Goals Patient Stated Goal: return home PT Goal Formulation: With patient Time For Goal Achievement: 12/16/17 Potential to Achieve Goals: Good    Frequency Min 3X/week   Barriers to discharge        Co-evaluation PT/OT/SLP Co-Evaluation/Treatment: Yes Reason for Co-Treatment: Complexity of the patient's impairments (multi-system involvement);Necessary to address cognition/behavior during functional activity;To address functional/ADL transfers PT goals addressed during session: Mobility/safety with mobility;Balance;Proper use of DME OT goals addressed during session: ADL's and self-care        AM-PAC PT "6 Clicks" Daily Activity  Outcome Measure Difficulty turning over in bed (including adjusting bedclothes, sheets and blankets)?: A Little Difficulty moving from lying on back to sitting on the side of the bed? : A Little Difficulty sitting down on and standing up from a chair with arms (e.g., wheelchair, bedside commode, etc,.)?: A Little Help needed moving to and from a bed to chair (including a wheelchair)?: A Little Help needed walking in hospital room?: A Little Help needed climbing 3-5 steps with a railing? : A Little 6 Click Score: 18    End of Session Equipment Utilized During Treatment: Gait belt Activity Tolerance: Patient tolerated treatment well Patient left: in chair;with call bell/phone within reach;with chair alarm set Nurse Communication: Mobility status PT Visit Diagnosis: Muscle weakness (generalized) (M62.81);Difficulty in walking, not elsewhere classified (R26.2);History of falling (Z91.81);Other symptoms and signs involving the nervous system (W46.659)    Time: 9357-0177 PT Time Calculation (min) (ACUTE ONLY): 29 min   Charges:      Wells Guiles B. Cameshia Cressman, PT, DPT [phone]   PT Evaluation $PT Eval Moderate Complexity: 1 Mod     12/02/2017, 10:56 AM

## 2017-12-03 MED ORDER — LEVETIRACETAM 500 MG PO TABS: 500.0000 mg | ORAL_TABLET | Freq: Two times a day (BID) | ORAL | 0 refills | Status: AC

## 2017-12-03 MED ORDER — HYDROCODONE-ACETAMINOPHEN 5-325 MG PO TABS: 1.0000 | ORAL_TABLET | ORAL | 0 refills | Status: AC | PRN

## 2017-12-03 NOTE — Care Management Note (Signed)
Case Management Note  Patient Details  Name: JONMICHAEL BEADNELL MRN: [id number] Date of Birth: 09-06-1934  Subjective/Objective: Pt is an 83 y.o. male presenting after multiple falls at home, generalized weakness, and confusion. CT on 6/15 + large bilateral subdural hematoma with 7.45mm midline shift.  PTA, pt independent of ADLS, lives next door to daughter.                     Action/Plan: Pt medically stable for discharge home today.  PT/OT recommending [place] follow up, and pt agreeable to [facility] services.  Pt requesting RW with seat, if possible.  Referral to [facility] for [facility] and DME needs; pt has used [facility] in the past for [facility] needs.    Expected Discharge Date:  12/03/17               Expected Discharge Plan:  [place]  In-House Referral:     Discharge planning Services  CM Consult  Post Acute Care Choice:  Home Health Choice offered to:  Patient  DME Arranged:  Walker rolling with seat DME Agency:  [place] Arranged:  PT, OT [facility] Agency:  [place]  Status of Service:  Completed, signed off  If discussed at [place] of Stay Meetings, dates discussed:    Additional Comments:  Reinaldo Raddle, RN, BSN  Trauma/Neuro ICU Case Manager [phone]

## 2017-12-03 NOTE — Progress Notes (Signed)
Discharge orders received, pt for discharge home today with home health PT per [place], IV D/C, incision to head open to air, staples intact.  D/C instructions and Rx given with verbalized understanding, including to make appointment with Dr. Vertell Limber within 7 days for follow up and staple removal.  Pt reports that he has a friend who is a Animal nutritionist who can assist him with that.  Pt advised to keep appointment, and family agreed to take him.  Family at bedside to assist pt with discharge. Staff brought pt downstairs via wheelchair.

## 2017-12-03 NOTE — Discharge Summary (Signed)
Physician Discharge Summary  Patient ID: Angel Hatfield MRN: [id number] DOB/AGE: Jan 05, 1935 83 y.o.  Admit date: 11/27/2017 Discharge date: 12/03/2017  Admission Diagnoses:  sdh  Discharge Diagnoses:  Same Active Problems:   S/P craniotomy   Subdural hematoma [facility])   Discharged Condition: Stable  Hospital Course:  Angel Hatfield is a 83 y.o. male  Who presented to ER on 6/15 due to recurrent falls and confusion. He was found to have bilateral SDH with >L mass effect and MLS. He underwent below procedure. There were no post operative complications. He was monitored in neuro ICU where he remained stable. PT/OT rec HH. Agree with this. At time of discharge, pain was well controlled, ambulating with Pt/OT, tolerating po, voiding normal. Ready for discharge. He will need to complete 7 day course of Keppra for seizure prophylaxis.    Treatments: Surgery BILATERAL CRANIOTOMIES FOR SUBDURAL HEMATOMA EVACUATION (Bilateral) - BILATERAL CRANIOTOMIES FOR SUBDURAL HEMATOMA EVACUATION   Discharge Exam: Blood pressure 134/71, pulse (!) 47, temperature 97.6 F (36.4 C), temperature source Oral, resp. rate 11, height 5\' 8"  (1.727 m), weight 78 kg (172 lb), SpO2 99 %. Awake, alert, oriented Speech fluent, appropriate CN grossly intact 5/5 BUE/BLE Wound c/d/i  Disposition: Discharge disposition: 01-Home or Self Care       Discharge Instructions    Call MD for:  difficulty breathing, headache or visual disturbances   Complete by:  As directed    Call MD for:  persistant dizziness or light-headedness   Complete by:  As directed    Call MD for:  redness, tenderness, or signs of infection (pain, swelling, redness, odor or green/yellow discharge around incision site)   Complete by:  As directed    Call MD for:  severe uncontrolled pain   Complete by:  As directed    Call MD for:  temperature >100.4   Complete by:  As directed    Diet general   Complete by:  As directed    Driving  Restrictions   Complete by:  As directed    Do not drive until given clearance.   Increase activity slowly   Complete by:  As directed    Lifting restrictions   Complete by:  As directed    Do not lift anything >10lbs. Avoid bending and twisting in awkward positions. Avoid bending at the back.   May shower / Bathe   Complete by:  As directed    In 24 hours. Okay to wash wound with warm soapy water. Avoid scrubbing the wound. Pat dry.   Remove dressing in 24 hours   Complete by:  As directed      Allergies as of 12/03/2017      Reactions   Penicillins Anaphylaxis   Has patient had a PCN reaction causing immediate rash, facial/tongue/throat swelling, SOB or lightheadedness with hypotension: Yes Has patient had a PCN reaction causing severe rash involving mucus membranes or skin necrosis: Unknown Has patient had a PCN reaction that required hospitalization: Unknown Has patient had a PCN reaction occurring within the last 10 years: Unknown If all of the above answers are "NO", then may proceed with Cephalosporin use.      Medication List    TAKE these medications   amLODipine 5 MG tablet Commonly known as:  NORVASC Take 5 mg by mouth daily.   HYDROcodone-acetaminophen 5-325 MG tablet Commonly known as:  NORCO/VICODIN Take 1 tablet by mouth every 4 (four) hours as needed for moderate pain.   levETIRAcetam  500 MG tablet Commonly known as:  KEPPRA Take 1 tablet (500 mg total) by mouth 2 (two) times daily.   lisinopril-hydrochlorothiazide 20-12.5 MG tablet Commonly known as:  PRINZIDE,ZESTORETIC Take 0.5 tablets by mouth daily.   omeprazole 40 MG capsule Commonly known as:  PRILOSEC Take 40 mg by mouth daily.   tamsulosin 0.4 MG Caps capsule Commonly known as:  FLOMAX Take 0.4 mg by mouth daily with supper.   traZODone 100 MG tablet Commonly known as:  DESYREL Take 100 mg by mouth at bedtime as needed for sleep.      Follow-up Information    Erline Levine, MD.  Schedule an appointment as soon as possible for a visit in 7 day(s).   Specialty:  Neurosurgery Contact information: [address] [place] [address] [phone]           Signed: Traci Sermon 12/03/2017, 8:03 AM

## 2017-12-03 NOTE — Progress Notes (Signed)
Occupational Therapy Treatment Patient Details Name: Angel Hatfield MRN: [id number] DOB: 1934-08-12 Today's Date: 12/03/2017    History of present illness Pt is an 83 y.o. male presenting after multiple falls at home, generalized weakness, and confusion. CT on 6/15 + large bilateral subdural hematoma with 7.92mm midline shift. Pt now s/p bil craniotomies for subdural hematoma evacuation on 6/15. PMHx: Arthritis, Asthma, Barrett's esophagus, GERD, Hearing impairment, HTN, Visual impairment, L ring finger amputation, R TKA.   OT comments  Pt continues to demonstrate unsafe behavior during functional activities today but with improvements in awareness of deficits and decreased impulsivity. Educated pt on home safety and fall prevention; pt able to identify some ways in which he can reduce falls at home. D/c plan remains appropriate. Will continue to follow acutely.   Follow Up Recommendations  Home health OT;Supervision/Assistance - 24 hour    Equipment Recommendations  None recommended by OT    Recommendations for Other Services      Precautions / Restrictions Precautions Precautions: Fall Restrictions Weight Bearing Restrictions: No       Mobility Bed Mobility Overal bed mobility: Needs Assistance Bed Mobility: Supine to Sit;Sit to Supine     Supine to sit: Supervision Sit to supine: Supervision   General bed mobility comments: Supervision for safety, no physical assist required  Transfers Overall transfer level: Needs assistance Equipment used: Rolling walker (2 wheeled) Transfers: Sit to/from Stand Sit to Stand: Supervision         General transfer comment: Supervision for safety    Balance Overall balance assessment: Needs assistance Sitting-balance support: Feet supported Sitting balance-Leahy Scale: Good     Standing balance support: Bilateral upper extremity supported Standing balance-Leahy Scale: Poor Standing balance comment: bil UE support in standing  throughout                           ADL either performed or assessed with clinical judgement   ADL Overall ADL's : Needs assistance/impaired                     Lower Body Dressing: Supervision/safety Lower Body Dressing Details (indicate cue type and reason): sitting to don socks Toilet Transfer: Supervision/safety;Ambulation;RW           Functional mobility during ADLs: Supervision/safety;Rolling walker General ADL Comments: Discussed no showering for 24 hours due to risk of infection. Educated pt on home safety and fall prevention strategies; pt verbalized understanding     Manufacturing systems engineer      Cognition Arousal/Alertness: Awake/alert Behavior During Therapy: Impulsive Overall Cognitive Status: No family/caregiver present to determine baseline cognitive functioning Area of Impairment: Safety/judgement;Problem solving                   Current Attention Level: Sustained    Safety/Judgement: Decreased awareness of safety Awareness: Emergent Problem Solving: Requires verbal cues General Comments: Noted improvement in impulsivity today vs session yesterday. Pt continues to demonstrate unsafe behavior duirng functional activities but more aware of deficits today        Exercises     Shoulder Instructions       General Comments      Pertinent Vitals/ Pain       Pain Assessment: No/denies pain  Home Living  Prior Functioning/Environment              Frequency  Min 2X/week        Progress Toward Goals  OT Goals(current goals can now be found in the care plan section)  Progress towards OT goals: Progressing toward goals  Acute Rehab OT Goals Patient Stated Goal: return home OT Goal Formulation: With patient  Plan Discharge plan remains appropriate    Co-evaluation                 AM-PAC PT "6 Clicks" Daily Activity     Outcome  Measure   Help from another person eating meals?: None Help from another person taking care of personal grooming?: A Little Help from another person toileting, which includes using toliet, bedpan, or urinal?: A Little Help from another person bathing (including washing, rinsing, drying)?: A Little Help from another person to put on and taking off regular upper body clothing?: A Little Help from another person to put on and taking off regular lower body clothing?: A Little 6 Click Score: 19    End of Session Equipment Utilized During Treatment: Rolling walker  OT Visit Diagnosis: Unsteadiness on feet (R26.81);Repeated falls (R29.6)   Activity Tolerance Patient tolerated treatment well   Patient Left in bed;with call bell/phone within reach   Nurse Communication          Time: 7703-4035 OT Time Calculation (min): 14 min  Charges: OT General Charges $OT Visit: 1 Visit OT Treatments $Therapeutic Activity: 8-22 mins  Mikaeel Petrow A. Ulice Brilliant, M.S., OTR/L Acute Rehab Department: [phone]   Binnie Kand 12/03/2017, 2:26 PM

## 2017-12-03 NOTE — Progress Notes (Signed)
Physical Therapy Treatment Patient Details Name: Angel Hatfield MRN: [id number] DOB: 1935-01-05 Today's Date: 12/03/2017    History of Present Illness Pt is an 83 y.o. male presenting after multiple falls at home, generalized weakness, and confusion. CT on 6/15 + large bilateral subdural hematoma with 7.65mm midline shift. Pt now s/p bil craniotomies for subdural hematoma evacuation on 6/15. PMHx: Arthritis, Asthma, Barrett's esophagus, GERD, Hearing impairment, HTN, Visual impairment, L ring finger amputation, R TKA.    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 Pt educated that he should not be on the tractor, needs 24hr supervision, needs assist for stairs and should not assist stairs with RW forward. Pt with no careover of education end of session and daughter arrived with education provided and she voiced understanding. Pt with gait speed too quick for safety and needs frequent reminders for safety with all aspects of mobility.    Follow Up Recommendations  Home health PT;Supervision/Assistance - 24 hour     Equipment Recommendations  Other (comment)(rollator)    Recommendations for Other Services       Precautions / Restrictions Precautions Precautions: Fall    Mobility  Bed Mobility               General bed mobility comments: Pt OOB upon arrival  Transfers Overall transfer level: Needs assistance   Transfers: Sit to/from Stand Sit to Stand: Min guard         General transfer comment: min guard assist for safety verbal cues for safe hand placement.   Ambulation/Gait Ambulation/Gait assistance: Min guard Gait Distance (Feet): 150 Feet Assistive device: Rolling walker (2 wheeled) Gait Pattern/deviations: Step-through pattern;Trunk flexed Gait velocity: fast, too fast to be safe right now.    General Gait Details: pt impulsive and  initiating very quick gait, too quick for pt safety. Pt required directional cues and assist to remain with RW. Unable to recall room number   Stairs Stairs: Yes Stairs assistance: Min guard Stair Management: One rail Right;Step to pattern;Forwards Number of Stairs: 4 General stair comments: pt initially attempting to put RW up the stairs and step up. Pt educated for folding RW and holding in left hand with rail grasped on RUE. Pt unable to recall reasoning or cues for stair mobility immediately after performing   Wheelchair Mobility    Modified Rankin (Stroke Patients Only) Modified Rankin (Stroke Patients Only) Pre-Morbid Rankin Score: No symptoms Modified Rankin: Moderately severe disability     Balance Overall balance assessment: Needs assistance Sitting-balance support: Feet supported Sitting balance-Leahy Scale: Good       Standing balance-Leahy Scale: Poor Standing balance comment: bil UE support in standing throughout                            Cognition Arousal/Alertness: Awake/alert Behavior During Therapy: Impulsive Overall Cognitive Status: Impaired/Different from baseline Area of Impairment: Memory;Safety/judgement;Problem solving                   Current Attention Level: Sustained Memory: Decreased short-term memory   Safety/Judgement: Decreased awareness of safety;Decreased awareness of deficits Awareness: Emergent Problem Solving: Requires verbal cues General Comments: Pt impulsive with poor safety awareness. Pt unable to recall education and instructions 30 sec after receiving them. Pt with no awareness of his fall risk or need for supervision  Exercises      General Comments        Pertinent Vitals/Pain Pain Assessment: No/denies pain    Home Living                      Prior Function            PT Goals (current goals can now be found in the care plan section) Progress towards PT goals: Progressing  toward goals    Frequency           PT Plan Current plan remains appropriate    Co-evaluation              AM-PAC PT "6 Clicks" Daily Activity  Outcome Measure  Difficulty turning over in bed (including adjusting bedclothes, sheets and blankets)?: A Little Difficulty moving from lying on back to sitting on the side of the bed? : A Little Difficulty sitting down on and standing up from a chair with arms (e.g., wheelchair, bedside commode, etc,.)?: A Little Help needed moving to and from a bed to chair (including a wheelchair)?: A Little Help needed walking in hospital room?: A Little Help needed climbing 3-5 steps with a railing? : A Little 6 Click Score: 18    End of Session Equipment Utilized During Treatment: Gait belt Activity Tolerance: Patient tolerated treatment well Patient left: in chair;with call bell/phone within reach;with chair alarm set Nurse Communication: Mobility status PT Visit Diagnosis: Muscle weakness (generalized) (M62.81);Difficulty in walking, not elsewhere classified (R26.2);History of falling (Z91.81);Other symptoms and signs involving the nervous system (R29.898)     Time: 1093-2355 PT Time Calculation (min) (ACUTE ONLY): 20 min  Charges:  $Gait Training: 8-22 mins                    G Codes:       [name], [name]    [place] 12/03/2017, 1:09 PM

## 2018-01-20 ENCOUNTER — Other Ambulatory Visit (HOSPITAL_COMMUNITY): Payer: Self-pay | Admitting: Neurosurgery

## 2018-01-20 DIAGNOSIS — S065X9A Traumatic subdural hemorrhage with loss of consciousness of unspecified duration, initial encounter: Secondary | ICD-10-CM

## 2018-01-20 DIAGNOSIS — S065XAA Traumatic subdural hemorrhage with loss of consciousness status unknown, initial encounter: Secondary | ICD-10-CM

## 2018-01-24 ENCOUNTER — Other Ambulatory Visit (HOSPITAL_COMMUNITY): Payer: Self-pay | Admitting: Neurosurgery

## 2018-01-31 ENCOUNTER — Ambulatory Visit (HOSPITAL_COMMUNITY)
Admission: RE | Admit: 2018-01-31 | Discharge: 2018-01-31 | Disposition: A | Discharge status: Home / Self Care | Payer: Medicare Other | Source: Ambulatory Visit | Attending: Neurosurgery | Admitting: Neurosurgery

## 2018-01-31 DIAGNOSIS — I6782 Cerebral ischemia: Secondary | ICD-10-CM | POA: Insufficient documentation

## 2018-01-31 DIAGNOSIS — J328 Other chronic sinusitis: Secondary | ICD-10-CM | POA: Diagnosis not present

## 2018-01-31 DIAGNOSIS — S065X9A Traumatic subdural hemorrhage with loss of consciousness of unspecified duration, initial encounter: Secondary | ICD-10-CM

## 2018-01-31 DIAGNOSIS — I6200 Nontraumatic subdural hemorrhage, unspecified: Secondary | ICD-10-CM | POA: Insufficient documentation

## 2018-01-31 DIAGNOSIS — S065XAA Traumatic subdural hemorrhage with loss of consciousness status unknown, initial encounter: Secondary | ICD-10-CM

## 2020-09-03 ENCOUNTER — Other Ambulatory Visit: Payer: Self-pay | Admitting: Physician Assistant

## 2020-09-03 DIAGNOSIS — R451 Restlessness and agitation: Secondary | ICD-10-CM

## 2020-09-03 DIAGNOSIS — R413 Other amnesia: Secondary | ICD-10-CM

## 2020-09-22 ENCOUNTER — Ambulatory Visit
Admission: RE | Admit: 2020-09-22 | Discharge: 2020-09-22 | Disposition: A | Discharge status: Home / Self Care | Payer: Medicare Other | Source: Ambulatory Visit | Attending: Physician Assistant | Admitting: Physician Assistant

## 2020-09-22 DIAGNOSIS — R451 Restlessness and agitation: Secondary | ICD-10-CM

## 2020-09-22 DIAGNOSIS — R413 Other amnesia: Secondary | ICD-10-CM

## 2021-08-22 IMAGING — MR MR HEAD W/O CM
8 series · 48 of 48 positions shown · non-contrast
Comparison: Head CT 01/31/2018

CLINICAL DATA: Chronic and increasing memory loss. History of head
injury 3 years ago

EXAM:
MRI HEAD WITHOUT CONTRAST
TECHNIQUE: Multiplanar, multiecho pulse sequences of the brain and surrounding
structures were obtained without intravenous contrast.

[Series 3: T1 · sagittal · 5.0mm · 0.45mm/px · 3 of 21 slices shown]
[im 1/21]
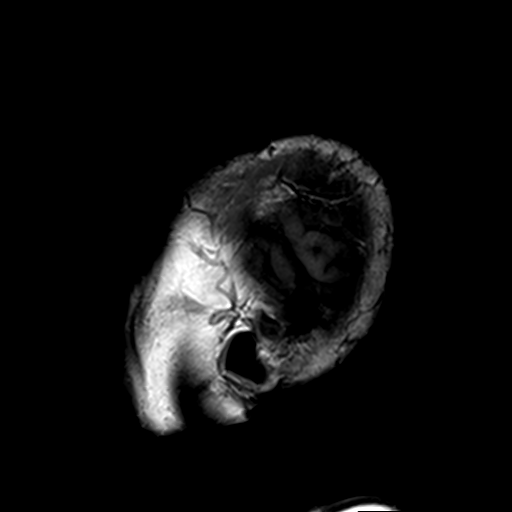
[im 11/21]
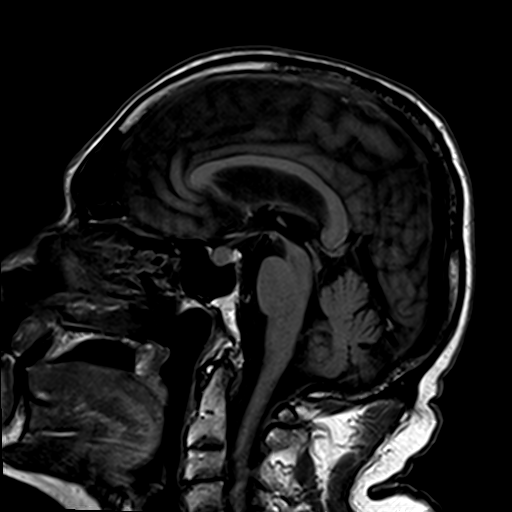
[im 21/21]
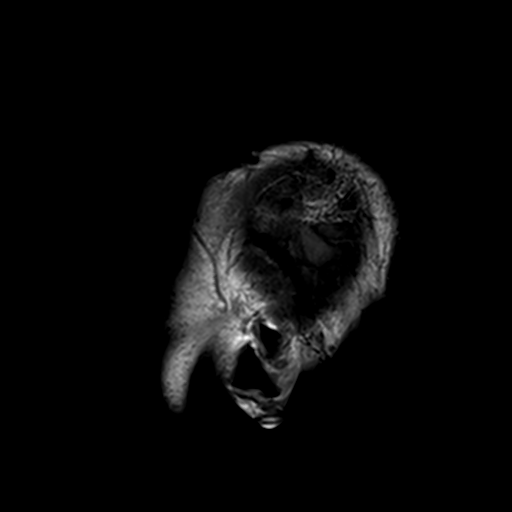

[Series 4: DWI · axial · 3.0mm · 1.80mm/px · z∈[-54,+78]mm · 11 of 96 slices shown (1 of 2)]
[im 1/96]
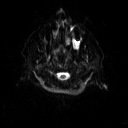
[im 10/96]
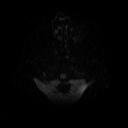
[im 20/96]
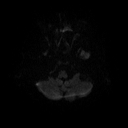
[im 29/96]
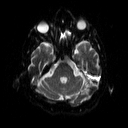
[im 39/96]
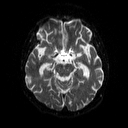
[im 48/96]
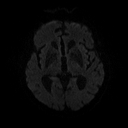
[im 58/96]
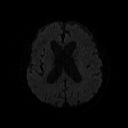
[im 67/96]
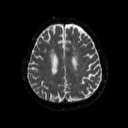
[im 77/96]
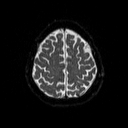
[im 86/96]
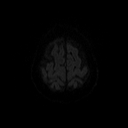
[im 96/96]
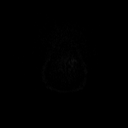

[Series 5: DWI · axial · 3.0mm · 1.80mm/px · z∈[-54,+78]mm · 5 of 47 slices shown (2 of 2)]
[im 1/47]
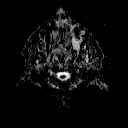
[im 12/47]
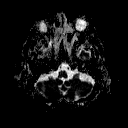
[im 24/47]
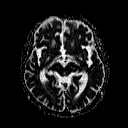
[im 35/47]
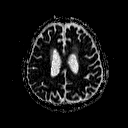
[im 47/47]
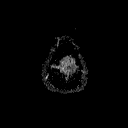

[Series 6: T2 · axial · 5.0mm · 0.51mm/px · z∈[-52,+81]mm · 3 of 22 slices shown (1 of 2)]
[im 1/22]
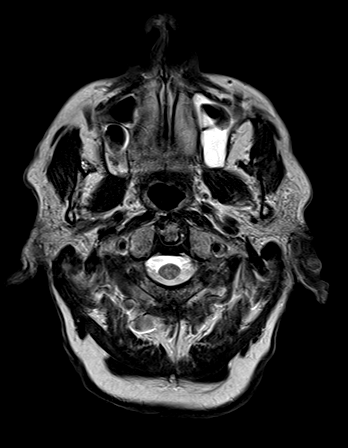
[im 11/22]
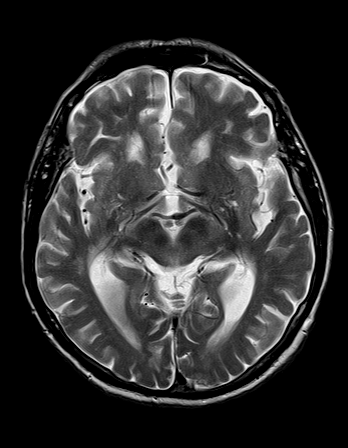
[im 22/22]
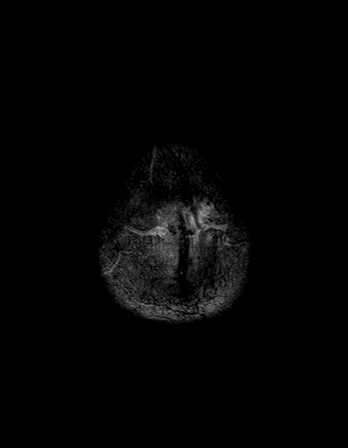

[Series 7: FLAIR · axial · 3.0mm · 0.45mm/px · z∈[-49,+73]mm · 3 of 30 slices shown]
[im 1/30]
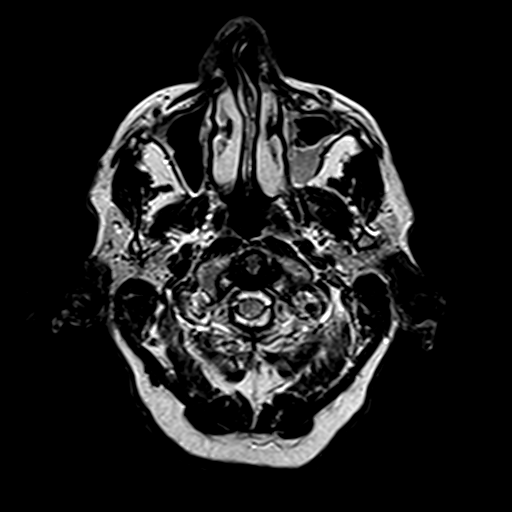
[im 15/30]
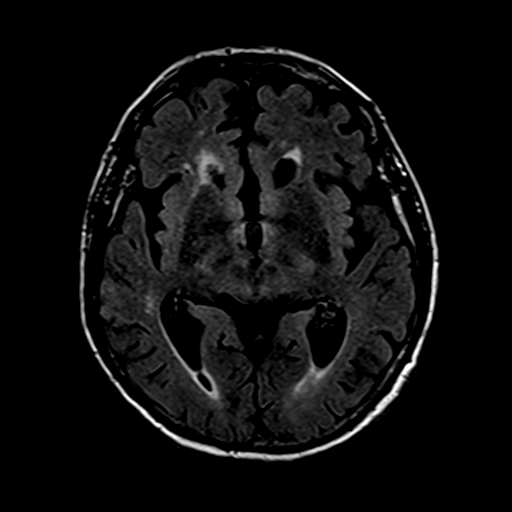
[im 30/30]
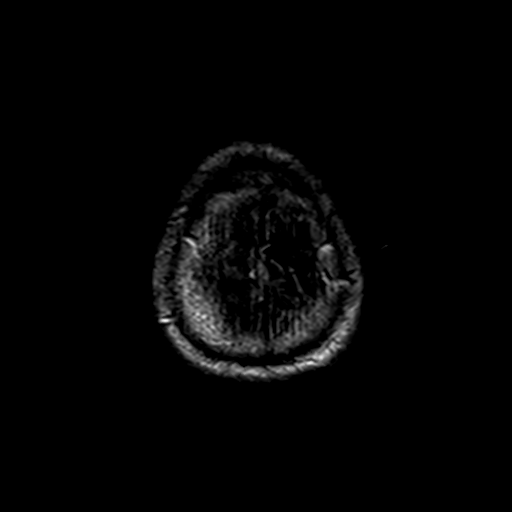

[Series 9: swi_images · axial · 4.0mm · 0.90mm/px · z∈[-50,+76]mm · 4 of 36 slices shown]
[im 1/36]
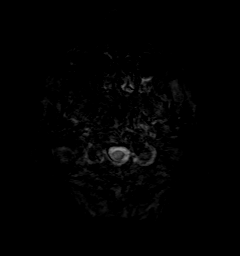
[im 12/36]
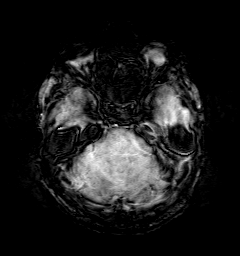
[im 24/36]
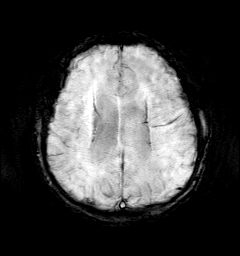
[im 36/36]
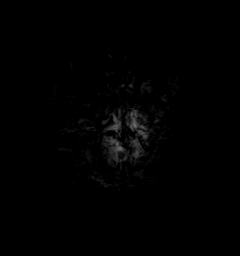

[Series 10: t1_mpr_tra · axial · 1.0mm · 0.71mm/px · z∈[-50,+79]mm · 16 of 144 slices shown]
[im 1/144]
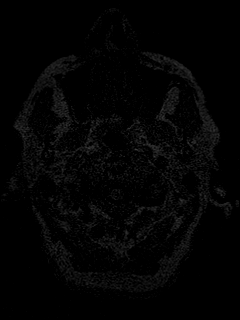
[im 10/144]
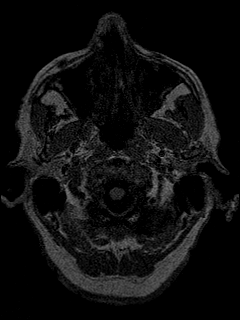
[im 20/144]
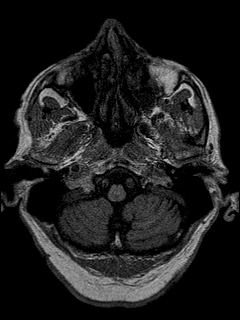
[im 29/144]
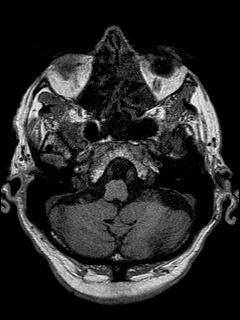
[im 39/144]
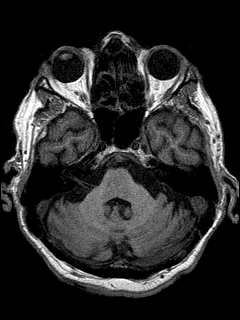
[im 48/144]
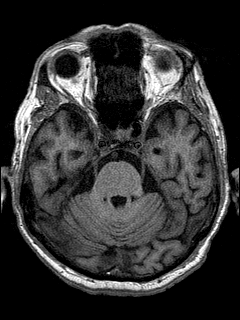
[im 58/144]
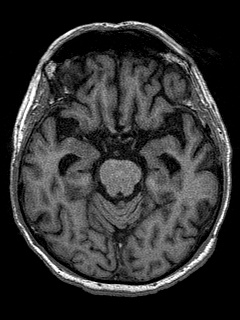
[im 67/144]
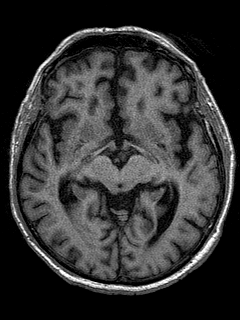
[im 77/144]
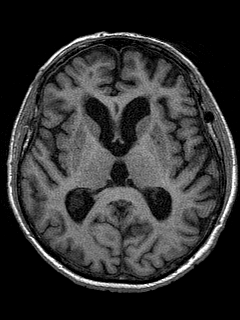
[im 86/144]
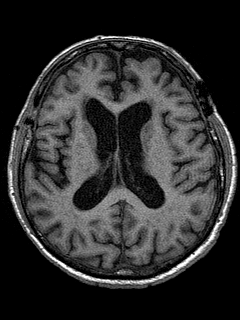
[im 96/144]
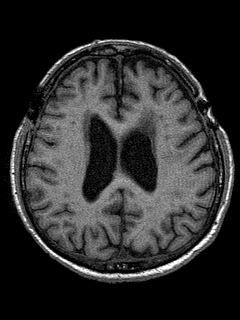
[im 105/144]
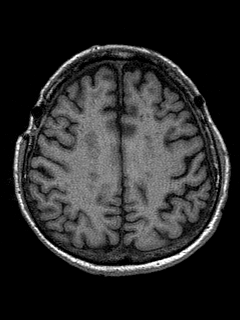
[im 115/144]
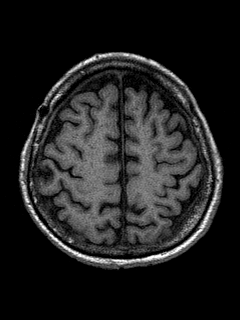
[im 124/144]
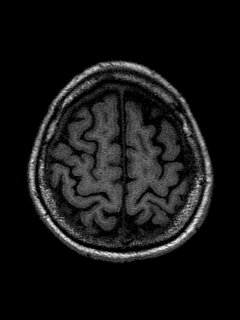
[im 134/144]
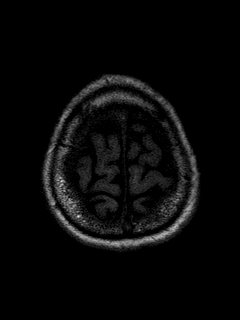
[im 144/144]
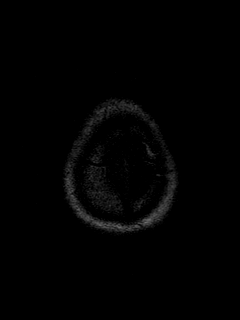

[Series 11: T2 · coronal · 5.0mm · 0.45mm/px · 3 of 25 slices shown (2 of 2)]
[im 1/25]
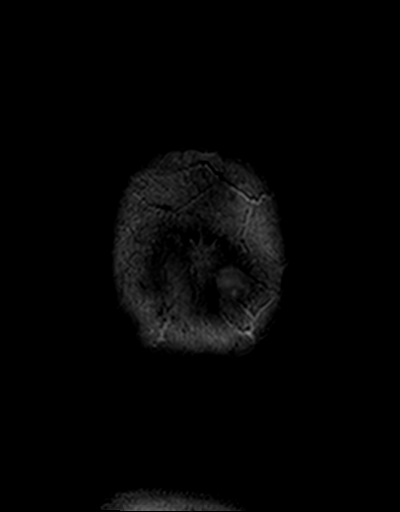
[im 13/25]
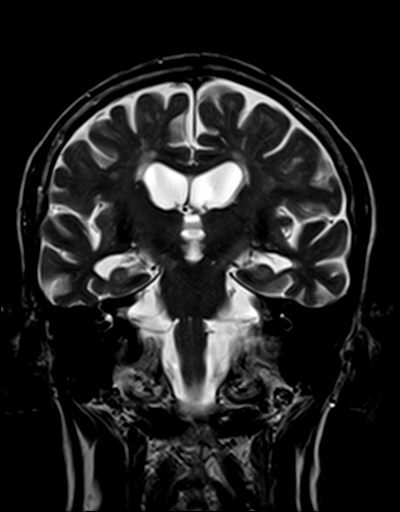
[im 25/25]
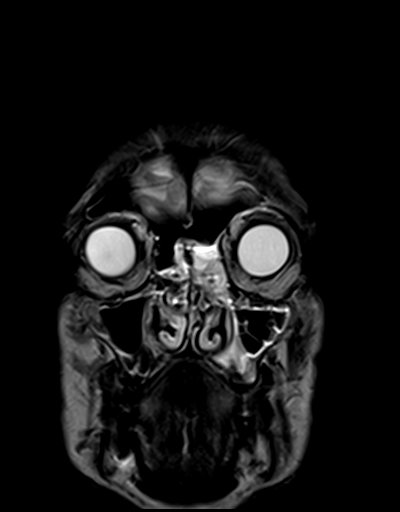

[48 of 48 positions shown; findings below may reference images not displayed]

FINDINGS: Brain: No acute infarction, hemorrhage, hydrocephalus, extra-axial
collection or mass lesion. Cerebral volume loss since prior with
lateral ventriculomegaly. When the frontal horn span is measured it
is nearly 1 cm larger than before, a definite change even when
allowing for subdural collections on prior. Chronic small vessel
ischemic type change in the periventricular white matter which is
mild for age. Small focus of cortical gliosis in the inferior right
frontal lobe potentially posttraumatic given location.

Upward convexity of the pituitary gland which appears similar to a
6485 brain MRI.

Vascular: Preserved flow voids

Skull and upper cervical spine: Nodes biparietal craniotomy
presumably for subdural hematoma evacuation.

Sinuses/Orbits: Patchy opacification of primarily maxillary and
ethmoid sinuses with secretions layering in the left maxillary
sinus.
IMPRESSION: 1. No reversible finding.
2. Generalized brain atrophy since [DATE]. Mild for age chronic small vessel ischemia.
4. Active sinusitis.
